# Patient Record
Sex: Male | Born: 1960 | Race: White | Hispanic: No | Marital: Married | Smoking: Former smoker
Health system: Southern US, Community
[De-identification: ages and names within clinical notes are randomized; demographics above are authoritative.]

## PROBLEM LIST (undated history)

## (undated) DIAGNOSIS — E119 Type 2 diabetes mellitus without complications: Secondary | ICD-10-CM

## (undated) DIAGNOSIS — I214 Non-ST elevation (NSTEMI) myocardial infarction: Secondary | ICD-10-CM

## (undated) DIAGNOSIS — J9601 Acute respiratory failure with hypoxia: Secondary | ICD-10-CM

## (undated) DIAGNOSIS — E785 Hyperlipidemia, unspecified: Secondary | ICD-10-CM

## (undated) DIAGNOSIS — J441 Chronic obstructive pulmonary disease with (acute) exacerbation: Secondary | ICD-10-CM

## (undated) DIAGNOSIS — I10 Essential (primary) hypertension: Secondary | ICD-10-CM

## (undated) DIAGNOSIS — Z720 Tobacco use: Secondary | ICD-10-CM

---

## 2017-06-04 DIAGNOSIS — Z6835 Body mass index (BMI) 35.0-35.9, adult: Secondary | ICD-10-CM | POA: Insufficient documentation

## 2017-06-04 DIAGNOSIS — K429 Umbilical hernia without obstruction or gangrene: Secondary | ICD-10-CM | POA: Insufficient documentation

## 2017-06-04 HISTORY — DX: Umbilical hernia without obstruction or gangrene: K42.9

## 2017-06-04 HISTORY — DX: Body mass index (BMI) 35.0-35.9, adult: Z68.35

## 2017-10-25 ENCOUNTER — Inpatient Hospital Stay (HOSPITAL_COMMUNITY)
Admission: EM | Disposition: A | Discharge status: Home / Self Care | Payer: Self-pay | Source: Home / Self Care | Attending: Cardiothoracic Surgery

## 2017-10-25 ENCOUNTER — Encounter (HOSPITAL_COMMUNITY): Payer: Self-pay | Admitting: Emergency Medicine

## 2017-10-25 ENCOUNTER — Emergency Department (HOSPITAL_COMMUNITY): Payer: BLUE CROSS/BLUE SHIELD | Admitting: Certified Registered Nurse Anesthetist

## 2017-10-25 ENCOUNTER — Emergency Department (HOSPITAL_COMMUNITY): Payer: BLUE CROSS/BLUE SHIELD

## 2017-10-25 ENCOUNTER — Other Ambulatory Visit: Payer: Self-pay

## 2017-10-25 ENCOUNTER — Inpatient Hospital Stay (HOSPITAL_COMMUNITY)
Admission: EM | Admit: 2017-10-25 | Discharge: 2017-11-03 | DRG: 233 | Disposition: A | Discharge status: Home / Self Care | Payer: BLUE CROSS/BLUE SHIELD | Attending: Cardiothoracic Surgery | Admitting: Cardiothoracic Surgery

## 2017-10-25 DIAGNOSIS — I4891 Unspecified atrial fibrillation: Secondary | ICD-10-CM | POA: Diagnosis present

## 2017-10-25 DIAGNOSIS — Z6836 Body mass index (BMI) 36.0-36.9, adult: Secondary | ICD-10-CM

## 2017-10-25 DIAGNOSIS — E1165 Type 2 diabetes mellitus with hyperglycemia: Secondary | ICD-10-CM | POA: Diagnosis present

## 2017-10-25 DIAGNOSIS — J90 Pleural effusion, not elsewhere classified: Secondary | ICD-10-CM

## 2017-10-25 DIAGNOSIS — F17210 Nicotine dependence, cigarettes, uncomplicated: Secondary | ICD-10-CM | POA: Diagnosis present

## 2017-10-25 DIAGNOSIS — E785 Hyperlipidemia, unspecified: Secondary | ICD-10-CM

## 2017-10-25 DIAGNOSIS — J9811 Atelectasis: Secondary | ICD-10-CM | POA: Diagnosis not present

## 2017-10-25 DIAGNOSIS — E871 Hypo-osmolality and hyponatremia: Secondary | ICD-10-CM | POA: Diagnosis not present

## 2017-10-25 DIAGNOSIS — Z09 Encounter for follow-up examination after completed treatment for conditions other than malignant neoplasm: Secondary | ICD-10-CM

## 2017-10-25 DIAGNOSIS — R609 Edema, unspecified: Secondary | ICD-10-CM

## 2017-10-25 DIAGNOSIS — I2119 ST elevation (STEMI) myocardial infarction involving other coronary artery of inferior wall: Secondary | ICD-10-CM | POA: Diagnosis present

## 2017-10-25 DIAGNOSIS — E669 Obesity, unspecified: Secondary | ICD-10-CM | POA: Diagnosis present

## 2017-10-25 DIAGNOSIS — M7989 Other specified soft tissue disorders: Secondary | ICD-10-CM | POA: Diagnosis not present

## 2017-10-25 DIAGNOSIS — D62 Acute posthemorrhagic anemia: Secondary | ICD-10-CM | POA: Diagnosis not present

## 2017-10-25 DIAGNOSIS — Z23 Encounter for immunization: Secondary | ICD-10-CM | POA: Diagnosis not present

## 2017-10-25 DIAGNOSIS — T502X5A Adverse effect of carbonic-anhydrase inhibitors, benzothiadiazides and other diuretics, initial encounter: Secondary | ICD-10-CM | POA: Diagnosis not present

## 2017-10-25 DIAGNOSIS — I5031 Acute diastolic (congestive) heart failure: Secondary | ICD-10-CM | POA: Diagnosis not present

## 2017-10-25 DIAGNOSIS — I4901 Ventricular fibrillation: Secondary | ICD-10-CM

## 2017-10-25 DIAGNOSIS — J441 Chronic obstructive pulmonary disease with (acute) exacerbation: Secondary | ICD-10-CM | POA: Diagnosis not present

## 2017-10-25 DIAGNOSIS — Z951 Presence of aortocoronary bypass graft: Secondary | ICD-10-CM

## 2017-10-25 DIAGNOSIS — I2111 ST elevation (STEMI) myocardial infarction involving right coronary artery: Secondary | ICD-10-CM | POA: Diagnosis present

## 2017-10-25 DIAGNOSIS — J9601 Acute respiratory failure with hypoxia: Secondary | ICD-10-CM

## 2017-10-25 DIAGNOSIS — Z720 Tobacco use: Secondary | ICD-10-CM | POA: Diagnosis not present

## 2017-10-25 DIAGNOSIS — I2510 Atherosclerotic heart disease of native coronary artery without angina pectoris: Secondary | ICD-10-CM

## 2017-10-25 DIAGNOSIS — Z01810 Encounter for preprocedural cardiovascular examination: Secondary | ICD-10-CM | POA: Diagnosis not present

## 2017-10-25 DIAGNOSIS — Y92230 Patient room in hospital as the place of occurrence of the external cause: Secondary | ICD-10-CM | POA: Diagnosis not present

## 2017-10-25 DIAGNOSIS — J9690 Respiratory failure, unspecified, unspecified whether with hypoxia or hypercapnia: Secondary | ICD-10-CM

## 2017-10-25 DIAGNOSIS — Z79890 Hormone replacement therapy (postmenopausal): Secondary | ICD-10-CM

## 2017-10-25 DIAGNOSIS — I469 Cardiac arrest, cause unspecified: Secondary | ICD-10-CM

## 2017-10-25 DIAGNOSIS — E119 Type 2 diabetes mellitus without complications: Secondary | ICD-10-CM

## 2017-10-25 DIAGNOSIS — I25110 Atherosclerotic heart disease of native coronary artery with unstable angina pectoris: Secondary | ICD-10-CM | POA: Diagnosis not present

## 2017-10-25 DIAGNOSIS — J939 Pneumothorax, unspecified: Secondary | ICD-10-CM

## 2017-10-25 DIAGNOSIS — J690 Pneumonitis due to inhalation of food and vomit: Secondary | ICD-10-CM | POA: Diagnosis not present

## 2017-10-25 DIAGNOSIS — I361 Nonrheumatic tricuspid (valve) insufficiency: Secondary | ICD-10-CM | POA: Diagnosis not present

## 2017-10-25 DIAGNOSIS — I110 Hypertensive heart disease with heart failure: Secondary | ICD-10-CM | POA: Diagnosis present

## 2017-10-25 DIAGNOSIS — D689 Coagulation defect, unspecified: Secondary | ICD-10-CM | POA: Diagnosis not present

## 2017-10-25 DIAGNOSIS — I10 Essential (primary) hypertension: Secondary | ICD-10-CM

## 2017-10-25 DIAGNOSIS — E876 Hypokalemia: Secondary | ICD-10-CM

## 2017-10-25 DIAGNOSIS — I214 Non-ST elevation (NSTEMI) myocardial infarction: Secondary | ICD-10-CM | POA: Diagnosis not present

## 2017-10-25 DIAGNOSIS — I472 Ventricular tachycardia: Secondary | ICD-10-CM | POA: Diagnosis not present

## 2017-10-25 DIAGNOSIS — I462 Cardiac arrest due to underlying cardiac condition: Secondary | ICD-10-CM | POA: Diagnosis present

## 2017-10-25 HISTORY — DX: Atherosclerotic heart disease of native coronary artery without angina pectoris: I25.10

## 2017-10-25 HISTORY — PX: CORONARY ARTERY BYPASS GRAFT: SHX141

## 2017-10-25 HISTORY — DX: Presence of aortocoronary bypass graft: Z95.1

## 2017-10-25 HISTORY — DX: Essential (primary) hypertension: I10

## 2017-10-25 HISTORY — DX: Hyperlipidemia, unspecified: E78.5

## 2017-10-25 HISTORY — DX: Cardiac arrest, cause unspecified: I46.9

## 2017-10-25 HISTORY — DX: Ventricular fibrillation: I49.01

## 2017-10-25 HISTORY — PX: LEFT HEART CATH AND CORONARY ANGIOGRAPHY: CATH118249

## 2017-10-25 HISTORY — PX: TEE WITHOUT CARDIOVERSION: SHX5443

## 2017-10-25 HISTORY — DX: Tobacco use: Z72.0

## 2017-10-25 HISTORY — DX: Type 2 diabetes mellitus without complications: E11.9

## 2017-10-25 HISTORY — PX: IABP INSERTION: CATH118242

## 2017-10-25 LAB — CBC
HCT: 43.3 % (ref 39.0–52.0)
HCT: 37.7 % — ABNORMAL LOW (ref 39.0–52.0)
HCT: 30.7 % — ABNORMAL LOW (ref 39.0–52.0)
Hemoglobin: 14.3 g/dL (ref 13.0–17.0)
Hemoglobin: 12.5 g/dL — ABNORMAL LOW (ref 13.0–17.0)
Hemoglobin: 10.1 g/dL — ABNORMAL LOW (ref 13.0–17.0)
MCH: 30.6 pg (ref 26.0–34.0)
MCH: 30.3 pg (ref 26.0–34.0)
MCH: 30.1 pg (ref 26.0–34.0)
MCHC: 33 g/dL (ref 30.0–36.0)
MCHC: 33.2 g/dL (ref 30.0–36.0)
MCHC: 32.9 g/dL (ref 30.0–36.0)
MCV: 92.5 fL (ref 78.0–100.0)
MCV: 91.3 fL (ref 78.0–100.0)
MCV: 91.6 fL (ref 78.0–100.0)
PLATELETS: 350 10*3/uL (ref 150–400)
PLATELETS: 297 10*3/uL (ref 150–400)
PLATELETS: 206 10*3/uL (ref 150–400)
RBC: 4.68 MIL/uL (ref 4.22–5.81)
RBC: 4.13 MIL/uL — AB (ref 4.22–5.81)
RBC: 3.35 MIL/uL — AB (ref 4.22–5.81)
RDW: 13.1 % (ref 11.5–15.5)
RDW: 13.3 % (ref 11.5–15.5)
RDW: 13.2 % (ref 11.5–15.5)
WBC: 13.1 10*3/uL — AB (ref 4.0–10.5)
WBC: 13.2 10*3/uL — AB (ref 4.0–10.5)
WBC: 15.2 10*3/uL — ABNORMAL HIGH (ref 4.0–10.5)

## 2017-10-25 LAB — COMPREHENSIVE METABOLIC PANEL
ALK PHOS: 73 U/L (ref 38–126)
ALT: 259 U/L — AB (ref 17–63)
AST: 170 U/L — ABNORMAL HIGH (ref 15–41)
Albumin: 3.4 g/dL — ABNORMAL LOW (ref 3.5–5.0)
Anion gap: 8 (ref 5–15)
BILIRUBIN TOTAL: 0.9 mg/dL (ref 0.3–1.2)
BUN: 13 mg/dL (ref 6–20)
CALCIUM: 8.6 mg/dL — AB (ref 8.9–10.3)
CO2: 22 mmol/L (ref 22–32)
CREATININE: 0.95 mg/dL (ref 0.61–1.24)
Chloride: 108 mmol/L (ref 101–111)
Glucose, Bld: 163 mg/dL — ABNORMAL HIGH (ref 65–99)
Potassium: 3.7 mmol/L (ref 3.5–5.1)
Sodium: 138 mmol/L (ref 135–145)
TOTAL PROTEIN: 5.7 g/dL — AB (ref 6.5–8.1)

## 2017-10-25 LAB — POCT I-STAT, CHEM 8
BUN: 13 mg/dL (ref 6–20)
BUN: 11 mg/dL (ref 6–20)
BUN: 12 mg/dL (ref 6–20)
BUN: 13 mg/dL (ref 6–20)
BUN: 14 mg/dL (ref 6–20)
CALCIUM ION: 1.23 mmol/L (ref 1.15–1.40)
CALCIUM ION: 0.94 mmol/L — AB (ref 1.15–1.40)
CALCIUM ION: 1.04 mmol/L — AB (ref 1.15–1.40)
CALCIUM ION: 1.07 mmol/L — AB (ref 1.15–1.40)
CHLORIDE: 108 mmol/L (ref 101–111)
CHLORIDE: 107 mmol/L (ref 101–111)
CHLORIDE: 105 mmol/L (ref 101–111)
CREATININE: 0.7 mg/dL (ref 0.61–1.24)
Calcium, Ion: 1.2 mmol/L (ref 1.15–1.40)
Chloride: 106 mmol/L (ref 101–111)
Chloride: 107 mmol/L (ref 101–111)
Creatinine, Ser: 0.5 mg/dL — ABNORMAL LOW (ref 0.61–1.24)
Creatinine, Ser: 0.7 mg/dL (ref 0.61–1.24)
Creatinine, Ser: 0.7 mg/dL (ref 0.61–1.24)
Creatinine, Ser: 0.7 mg/dL (ref 0.61–1.24)
GLUCOSE: 119 mg/dL — AB (ref 65–99)
GLUCOSE: 112 mg/dL — AB (ref 65–99)
GLUCOSE: 139 mg/dL — AB (ref 65–99)
Glucose, Bld: 91 mg/dL (ref 65–99)
Glucose, Bld: 150 mg/dL — ABNORMAL HIGH (ref 65–99)
HCT: 35 % — ABNORMAL LOW (ref 39.0–52.0)
HCT: 26 % — ABNORMAL LOW (ref 39.0–52.0)
HCT: 32 % — ABNORMAL LOW (ref 39.0–52.0)
HCT: 26 % — ABNORMAL LOW (ref 39.0–52.0)
HCT: 24 % — ABNORMAL LOW (ref 39.0–52.0)
HEMOGLOBIN: 10.9 g/dL — AB (ref 13.0–17.0)
HEMOGLOBIN: 8.2 g/dL — AB (ref 13.0–17.0)
Hemoglobin: 11.9 g/dL — ABNORMAL LOW (ref 13.0–17.0)
Hemoglobin: 8.8 g/dL — ABNORMAL LOW (ref 13.0–17.0)
Hemoglobin: 8.8 g/dL — ABNORMAL LOW (ref 13.0–17.0)
POTASSIUM: 5.7 mmol/L — AB (ref 3.5–5.1)
Potassium: 4.1 mmol/L (ref 3.5–5.1)
Potassium: 4.7 mmol/L (ref 3.5–5.1)
Potassium: 4.9 mmol/L (ref 3.5–5.1)
Potassium: 4.9 mmol/L (ref 3.5–5.1)
SODIUM: 140 mmol/L (ref 135–145)
SODIUM: 141 mmol/L (ref 135–145)
Sodium: 143 mmol/L (ref 135–145)
Sodium: 142 mmol/L (ref 135–145)
Sodium: 139 mmol/L (ref 135–145)
TCO2: 26 mmol/L (ref 22–32)
TCO2: 20 mmol/L — ABNORMAL LOW (ref 22–32)
TCO2: 23 mmol/L (ref 22–32)
TCO2: 27 mmol/L (ref 22–32)
TCO2: 23 mmol/L (ref 22–32)

## 2017-10-25 LAB — POCT I-STAT 3, ART BLOOD GAS (G3+)
ACID-BASE DEFICIT: 3 mmol/L — AB (ref 0.0–2.0)
Acid-Base Excess: 4 mmol/L — ABNORMAL HIGH (ref 0.0–2.0)
Acid-base deficit: 3 mmol/L — ABNORMAL HIGH (ref 0.0–2.0)
Acid-base deficit: 5 mmol/L — ABNORMAL HIGH (ref 0.0–2.0)
Acid-base deficit: 3 mmol/L — ABNORMAL HIGH (ref 0.0–2.0)
BICARBONATE: 23.1 mmol/L (ref 20.0–28.0)
Bicarbonate: 25.1 mmol/L (ref 20.0–28.0)
Bicarbonate: 21.1 mmol/L (ref 20.0–28.0)
Bicarbonate: 27.7 mmol/L (ref 20.0–28.0)
Bicarbonate: 22.8 mmol/L (ref 20.0–28.0)
Bicarbonate: 26.4 mmol/L (ref 20.0–28.0)
O2 SAT: 100 %
O2 SAT: 100 %
O2 SAT: 100 %
O2 SAT: 88 %
O2 Saturation: 89 %
O2 Saturation: 96 %
PCO2 ART: 43.3 mmHg (ref 32.0–48.0)
PH ART: 7.457 — AB (ref 7.350–7.450)
Patient temperature: 36.3
TCO2: 27 mmol/L (ref 22–32)
TCO2: 22 mmol/L (ref 22–32)
TCO2: 29 mmol/L (ref 22–32)
TCO2: 24 mmol/L (ref 22–32)
TCO2: 24 mmol/L (ref 22–32)
TCO2: 28 mmol/L (ref 22–32)
pCO2 arterial: 57.6 mmHg — ABNORMAL HIGH (ref 32.0–48.0)
pCO2 arterial: 39.3 mmHg (ref 32.0–48.0)
pCO2 arterial: 44.2 mmHg (ref 32.0–48.0)
pCO2 arterial: 44.5 mmHg (ref 32.0–48.0)
pCO2 arterial: 51 mmHg — ABNORMAL HIGH (ref 32.0–48.0)
pH, Arterial: 7.247 — ABNORMAL LOW (ref 7.350–7.450)
pH, Arterial: 7.297 — ABNORMAL LOW (ref 7.350–7.450)
pH, Arterial: 7.318 — ABNORMAL LOW (ref 7.350–7.450)
pH, Arterial: 7.326 — ABNORMAL LOW (ref 7.350–7.450)
pH, Arterial: 7.319 — ABNORMAL LOW (ref 7.350–7.450)
pO2, Arterial: 407 mmHg — ABNORMAL HIGH (ref 83.0–108.0)
pO2, Arterial: 265 mmHg — ABNORMAL HIGH (ref 83.0–108.0)
pO2, Arterial: 324 mmHg — ABNORMAL HIGH (ref 83.0–108.0)
pO2, Arterial: 59 mmHg — ABNORMAL LOW (ref 83.0–108.0)
pO2, Arterial: 61 mmHg — ABNORMAL LOW (ref 83.0–108.0)
pO2, Arterial: 91 mmHg (ref 83.0–108.0)

## 2017-10-25 LAB — LIPID PANEL
Cholesterol: 125 mg/dL (ref 0–200)
HDL: 42 mg/dL (ref >40)
LDL CALC: 75 mg/dL (ref 0–99)
TRIGLYCERIDES: 40 mg/dL (ref <150)
Total CHOL/HDL Ratio: 3 RATIO
VLDL: 8 mg/dL (ref 0–40)

## 2017-10-25 LAB — HEMOGLOBIN AND HEMATOCRIT, BLOOD
HCT: 28.6 % — ABNORMAL LOW (ref 39.0–52.0)
Hemoglobin: 9.4 g/dL — ABNORMAL LOW (ref 13.0–17.0)

## 2017-10-25 LAB — I-STAT CHEM 8, ED
BUN: 14 mg/dL (ref 6–20)
CALCIUM ION: 1.17 mmol/L (ref 1.15–1.40)
CHLORIDE: 105 mmol/L (ref 101–111)
CREATININE: 1.1 mg/dL (ref 0.61–1.24)
GLUCOSE: 213 mg/dL — AB (ref 65–99)
HCT: 43 % (ref 39.0–52.0)
Hemoglobin: 14.6 g/dL (ref 13.0–17.0)
POTASSIUM: 3.6 mmol/L (ref 3.5–5.1)
Sodium: 141 mmol/L (ref 135–145)
TCO2: 21 mmol/L — ABNORMAL LOW (ref 22–32)

## 2017-10-25 LAB — BASIC METABOLIC PANEL
ANION GAP: 16 — AB (ref 5–15)
BUN: 13 mg/dL (ref 6–20)
CALCIUM: 9.5 mg/dL (ref 8.9–10.3)
CO2: 19 mmol/L — AB (ref 22–32)
Chloride: 104 mmol/L (ref 101–111)
Creatinine, Ser: 1.2 mg/dL (ref 0.61–1.24)
GFR calc Af Amer: >60 mL/min (ref >60)
GLUCOSE: 219 mg/dL — AB (ref 65–99)
POTASSIUM: 3.7 mmol/L (ref 3.5–5.1)
Sodium: 139 mmol/L (ref 135–145)

## 2017-10-25 LAB — HEMOGLOBIN A1C
HEMOGLOBIN A1C: 6.2 % — AB (ref 4.8–5.6)
Mean Plasma Glucose: 131.24 mg/dL

## 2017-10-25 LAB — ECHOCARDIOGRAM COMPLETE: Weight: 3894.21 oz

## 2017-10-25 LAB — I-STAT TROPONIN, ED: Troponin i, poc: 0.01 ng/mL (ref 0.00–0.08)

## 2017-10-25 LAB — PREPARE RBC (CROSSMATCH)

## 2017-10-25 LAB — CBG MONITORING, ED: Glucose-Capillary: 200 mg/dL — ABNORMAL HIGH (ref 65–99)

## 2017-10-25 LAB — ABO/RH: ABO/RH(D): O POS

## 2017-10-25 LAB — PLATELET COUNT: Platelets: 233 10*3/uL (ref 150–400)

## 2017-10-25 LAB — APTT: aPTT: 27 seconds (ref 24–36)

## 2017-10-25 LAB — PROTIME-INR
INR: 1.27
INR: 1.4
PROTHROMBIN TIME: 15.8 s — AB (ref 11.4–15.2)
PROTHROMBIN TIME: 17 s — AB (ref 11.4–15.2)

## 2017-10-25 LAB — TROPONIN I: TROPONIN I: 0.1 ng/mL — AB (ref <0.03)

## 2017-10-25 SURGERY — CORONARY ARTERY BYPASS GRAFTING (CABG)
Anesthesia: General | Site: Chest

## 2017-10-25 SURGERY — LEFT HEART CATH AND CORONARY ANGIOGRAPHY
Anesthesia: LOCAL

## 2017-10-25 MED ORDER — VANCOMYCIN HCL IN DEXTROSE 1-5 GM/200ML-% IV SOLN
1000.0000 mg | Freq: Once | INTRAVENOUS | Status: AC
  Administered 2017-10-26: 1000 mg via INTRAVENOUS
  Filled 2017-10-25: qty 200

## 2017-10-25 MED ORDER — METOPROLOL TARTRATE 5 MG/5ML IV SOLN
2.5000 mg | INTRAVENOUS | Status: DC | PRN
  Administered 2017-10-27: 5 mg via INTRAVENOUS
  Filled 2017-10-25: qty 5

## 2017-10-25 MED ORDER — TRANEXAMIC ACID 1000 MG/10ML IV SOLN
1.5000 mg/kg/h | INTRAVENOUS | Status: AC
  Administered 2017-10-25: 1.5 mg/kg/h via INTRAVENOUS
  Filled 2017-10-25: qty 25

## 2017-10-25 MED ORDER — LIDOCAINE HCL 1 % IJ SOLN
INTRAMUSCULAR | Status: AC
  Filled 2017-10-25: qty 20

## 2017-10-25 MED ORDER — TRAMADOL HCL 50 MG PO TABS
50.0000 mg | ORAL_TABLET | ORAL | Status: DC | PRN
  Administered 2017-10-29 – 2017-10-30 (×2): 100 mg via ORAL
  Filled 2017-10-25 (×2): qty 2

## 2017-10-25 MED ORDER — ACETAMINOPHEN 650 MG RE SUPP: 650.0000 mg | Freq: Once | RECTAL | Status: AC

## 2017-10-25 MED ORDER — DEXMEDETOMIDINE HCL IN NACL 400 MCG/100ML IV SOLN
0.1000 ug/kg/h | INTRAVENOUS | Status: DC
  Filled 2017-10-25: qty 100

## 2017-10-25 MED ORDER — ASPIRIN EC 325 MG PO TBEC
325.0000 mg | DELAYED_RELEASE_TABLET | Freq: Every day | ORAL | Status: DC
  Administered 2017-10-27 – 2017-11-03 (×7): 325 mg via ORAL
  Filled 2017-10-25 (×7): qty 1

## 2017-10-25 MED ORDER — BISACODYL 10 MG RE SUPP: 10.0000 mg | Freq: Every day | RECTAL | Status: DC

## 2017-10-25 MED ORDER — MIDAZOLAM HCL 2 MG/2ML IJ SOLN
INTRAMUSCULAR | Status: AC
  Filled 2017-10-25: qty 2

## 2017-10-25 MED ORDER — HEMOSTATIC AGENTS (NO CHARGE) OPTIME
TOPICAL | Status: DC | PRN
  Administered 2017-10-25 (×4): 1 via TOPICAL

## 2017-10-25 MED ORDER — METOPROLOL TARTRATE 25 MG/10 ML ORAL SUSPENSION: 12.5000 mg | Freq: Two times a day (BID) | ORAL | Status: DC

## 2017-10-25 MED ORDER — MIDAZOLAM HCL 2 MG/2ML IJ SOLN
INTRAMUSCULAR | Status: DC | PRN
  Administered 2017-10-25 (×2): 1 mg via INTRAVENOUS

## 2017-10-25 MED ORDER — FENTANYL CITRATE (PF) 100 MCG/2ML IJ SOLN
INTRAMUSCULAR | Status: DC | PRN
  Administered 2017-10-25 (×2): 50 ug via INTRAVENOUS

## 2017-10-25 MED ORDER — HEPARIN SODIUM (PORCINE) 1000 UNIT/ML IJ SOLN
INTRAMUSCULAR | Status: DC | PRN
  Administered 2017-10-25: 37 mL via INTRAVENOUS
  Administered 2017-10-25: 2 mL via INTRAVENOUS

## 2017-10-25 MED ORDER — FENTANYL CITRATE (PF) 250 MCG/5ML IJ SOLN
INTRAMUSCULAR | Status: AC
  Filled 2017-10-25: qty 20

## 2017-10-25 MED ORDER — DOCUSATE SODIUM 100 MG PO CAPS
200.0000 mg | ORAL_CAPSULE | Freq: Every day | ORAL | Status: DC
  Administered 2017-10-27 – 2017-11-02 (×7): 200 mg via ORAL
  Filled 2017-10-25 (×8): qty 2

## 2017-10-25 MED ORDER — MORPHINE SULFATE (PF) 2 MG/ML IV SOLN
2.0000 mg | INTRAVENOUS | Status: DC | PRN
  Administered 2017-10-26: 2 mg via INTRAVENOUS
  Administered 2017-10-26 – 2017-10-27 (×4): 4 mg via INTRAVENOUS
  Administered 2017-10-27 – 2017-10-28 (×2): 2 mg via INTRAVENOUS
  Filled 2017-10-25: qty 2
  Filled 2017-10-25 (×2): qty 1
  Filled 2017-10-25 (×4): qty 2
  Filled 2017-10-25: qty 1

## 2017-10-25 MED ORDER — ACETAMINOPHEN 500 MG PO TABS
1000.0000 mg | ORAL_TABLET | Freq: Four times a day (QID) | ORAL | Status: AC
  Administered 2017-10-26 – 2017-10-30 (×13): 1000 mg via ORAL
  Filled 2017-10-25 (×15): qty 2

## 2017-10-25 MED ORDER — TRANEXAMIC ACID (OHS) BOLUS VIA INFUSION
15.0000 mg/kg | INTRAVENOUS | Status: AC
  Administered 2017-10-25: 1656 mg via INTRAVENOUS
  Filled 2017-10-25: qty 1656

## 2017-10-25 MED ORDER — ASPIRIN 81 MG PO CHEW
324.0000 mg | CHEWABLE_TABLET | Freq: Once | ORAL | Status: AC
  Administered 2017-10-25: 324 mg via ORAL
  Filled 2017-10-25: qty 4

## 2017-10-25 MED ORDER — FUROSEMIDE 10 MG/ML IJ SOLN
INTRAMUSCULAR | Status: AC
  Filled 2017-10-25: qty 4

## 2017-10-25 MED ORDER — BISACODYL 5 MG PO TBEC: 5.0000 mg | DELAYED_RELEASE_TABLET | Freq: Once | ORAL | Status: DC

## 2017-10-25 MED ORDER — SODIUM CHLORIDE 0.9% FLUSH: 3.0000 mL | INTRAVENOUS | Status: DC | PRN

## 2017-10-25 MED ORDER — METOCLOPRAMIDE HCL 5 MG/ML IJ SOLN
10.0000 mg | Freq: Four times a day (QID) | INTRAMUSCULAR | Status: AC
  Administered 2017-10-25 – 2017-10-30 (×19): 10 mg via INTRAVENOUS
  Filled 2017-10-25 (×18): qty 2

## 2017-10-25 MED ORDER — PLASMA-LYTE 148 IV SOLN
INTRAVENOUS | Status: DC
  Filled 2017-10-25: qty 2.5

## 2017-10-25 MED ORDER — NOREPINEPHRINE BITARTRATE 1 MG/ML IV SOLN
0.0000 ug/min | INTRAVENOUS | Status: DC
  Filled 2017-10-25: qty 4

## 2017-10-25 MED ORDER — SODIUM CHLORIDE 0.9 % IV SOLN
0.0000 ug/min | INTRAVENOUS | Status: DC
  Filled 2017-10-25: qty 2

## 2017-10-25 MED ORDER — ALBUMIN HUMAN 5 % IV SOLN
INTRAVENOUS | Status: DC | PRN
  Administered 2017-10-25 (×2): via INTRAVENOUS

## 2017-10-25 MED ORDER — CHLORHEXIDINE GLUCONATE 0.12 % MT SOLN: 15.0000 mL | Freq: Once | OROMUCOSAL | Status: DC

## 2017-10-25 MED ORDER — SODIUM BICARBONATE 8.4 % IV SOLN
INTRAVENOUS | Status: AC
  Filled 2017-10-25: qty 200

## 2017-10-25 MED ORDER — HEPARIN SODIUM (PORCINE) 1000 UNIT/ML IJ SOLN
INTRAMUSCULAR | Status: DC | PRN
  Administered 2017-10-25 (×2): 5000 [IU] via INTRAVENOUS

## 2017-10-25 MED ORDER — SODIUM CHLORIDE 0.9 % IV SOLN
INTRAVENOUS | Status: DC
  Administered 2017-10-27 (×2): via INTRAVENOUS

## 2017-10-25 MED ORDER — TEMAZEPAM 15 MG PO CAPS: 15.0000 mg | ORAL_CAPSULE | Freq: Once | ORAL | Status: DC | PRN

## 2017-10-25 MED ORDER — FENTANYL CITRATE (PF) 250 MCG/5ML IJ SOLN
INTRAMUSCULAR | Status: DC | PRN
  Administered 2017-10-25: 100 ug via INTRAVENOUS
  Administered 2017-10-25: 50 ug via INTRAVENOUS
  Administered 2017-10-25: 250 ug via INTRAVENOUS
  Administered 2017-10-25: 350 ug via INTRAVENOUS
  Administered 2017-10-25: 150 ug via INTRAVENOUS

## 2017-10-25 MED ORDER — PLASMA-LYTE 148 IV SOLN
INTRAVENOUS | Status: DC | PRN
  Administered 2017-10-25: 500 mL

## 2017-10-25 MED ORDER — METOPROLOL TARTRATE 12.5 MG HALF TABLET: 12.5000 mg | ORAL_TABLET | Freq: Once | ORAL | Status: DC

## 2017-10-25 MED ORDER — HEPARIN SODIUM (PORCINE) 1000 UNIT/ML IJ SOLN
INTRAMUSCULAR | Status: AC
  Filled 2017-10-25: qty 1

## 2017-10-25 MED ORDER — MIDAZOLAM HCL 2 MG/2ML IJ SOLN
2.0000 mg | INTRAMUSCULAR | Status: DC | PRN
  Administered 2017-10-26 (×4): 2 mg via INTRAVENOUS
  Filled 2017-10-25 (×6): qty 2

## 2017-10-25 MED ORDER — LACTATED RINGERS IV SOLN: INTRAVENOUS | Status: DC

## 2017-10-25 MED ORDER — MILRINONE LACTATE IN DEXTROSE 20-5 MG/100ML-% IV SOLN
0.1250 ug/kg/min | INTRAVENOUS | Status: DC
  Filled 2017-10-25: qty 100

## 2017-10-25 MED ORDER — SODIUM CHLORIDE 0.9 % IV SOLN
750.0000 mg | INTRAVENOUS | Status: AC
  Administered 2017-10-25: 750 mg via INTRAVENOUS
  Filled 2017-10-25: qty 750

## 2017-10-25 MED ORDER — SODIUM CHLORIDE 0.9 % IV SOLN
INTRAVENOUS | Status: DC
  Filled 2017-10-25: qty 1

## 2017-10-25 MED ORDER — LACTATED RINGERS IV SOLN
INTRAVENOUS | Status: DC | PRN
  Administered 2017-10-25 (×2): via INTRAVENOUS

## 2017-10-25 MED ORDER — SODIUM CHLORIDE 0.9 % IV SOLN
1.5000 g | INTRAVENOUS | Status: AC
  Administered 2017-10-25: 1.5 g via INTRAVENOUS
  Filled 2017-10-25: qty 1.5

## 2017-10-25 MED ORDER — NOREPINEPHRINE BITARTRATE 1 MG/ML IV SOLN
INTRAVENOUS | Status: DC | PRN
  Administered 2017-10-25: 3 ug/min via INTRAVENOUS

## 2017-10-25 MED ORDER — 0.9 % SODIUM CHLORIDE (POUR BTL) OPTIME
TOPICAL | Status: DC | PRN
  Administered 2017-10-25: 6000 mL

## 2017-10-25 MED ORDER — ASPIRIN 81 MG PO CHEW
324.0000 mg | CHEWABLE_TABLET | Freq: Every day | ORAL | Status: DC
  Administered 2017-10-26 – 2017-10-31 (×2): 324 mg
  Filled 2017-10-25 (×2): qty 4

## 2017-10-25 MED ORDER — LEVALBUTEROL HCL 0.63 MG/3ML IN NEBU
0.6300 mg | INHALATION_SOLUTION | Freq: Four times a day (QID) | RESPIRATORY_TRACT | Status: DC | PRN
  Administered 2017-10-28: 0.63 mg via RESPIRATORY_TRACT
  Filled 2017-10-25: qty 3

## 2017-10-25 MED ORDER — MAGNESIUM SULFATE 50 % IJ SOLN
40.0000 meq | INTRAMUSCULAR | Status: DC
  Filled 2017-10-25: qty 9.85

## 2017-10-25 MED ORDER — NOREPINEPHRINE BITARTRATE 1 MG/ML IV SOLN
0.0000 ug/min | INTRAVENOUS | Status: DC
  Administered 2017-10-26: 7 ug/min via INTRAVENOUS
  Filled 2017-10-25: qty 4

## 2017-10-25 MED ORDER — BISACODYL 5 MG PO TBEC
10.0000 mg | DELAYED_RELEASE_TABLET | Freq: Every day | ORAL | Status: DC
  Administered 2017-10-26 – 2017-11-02 (×8): 10 mg via ORAL
  Filled 2017-10-25 (×9): qty 2

## 2017-10-25 MED ORDER — PANTOPRAZOLE SODIUM 40 MG PO TBEC
40.0000 mg | DELAYED_RELEASE_TABLET | Freq: Every day | ORAL | Status: DC
  Administered 2017-10-27 – 2017-11-03 (×8): 40 mg via ORAL
  Filled 2017-10-25 (×8): qty 1

## 2017-10-25 MED ORDER — CEFAZOLIN SODIUM-DEXTROSE 2-4 GM/100ML-% IV SOLN
2.0000 g | Freq: Three times a day (TID) | INTRAVENOUS | Status: AC
  Administered 2017-10-26 – 2017-10-27 (×6): 2 g via INTRAVENOUS
  Filled 2017-10-25 (×6): qty 100

## 2017-10-25 MED ORDER — OXYCODONE HCL 5 MG PO TABS
5.0000 mg | ORAL_TABLET | ORAL | Status: DC | PRN
  Administered 2017-10-26 (×2): 10 mg via ORAL
  Administered 2017-10-26: 5 mg via ORAL
  Administered 2017-10-27 – 2017-10-28 (×10): 10 mg via ORAL
  Administered 2017-10-29: 5 mg via ORAL
  Administered 2017-10-29: 10 mg via ORAL
  Administered 2017-10-30 – 2017-10-31 (×2): 5 mg via ORAL
  Administered 2017-11-01 – 2017-11-02 (×3): 10 mg via ORAL
  Filled 2017-10-25 (×14): qty 2
  Filled 2017-10-25: qty 1
  Filled 2017-10-25: qty 2
  Filled 2017-10-25: qty 1
  Filled 2017-10-25: qty 2
  Filled 2017-10-25 (×2): qty 1

## 2017-10-25 MED ORDER — SODIUM CHLORIDE 0.9 % IV SOLN
30.0000 ug/min | INTRAVENOUS | Status: DC
  Filled 2017-10-25: qty 2

## 2017-10-25 MED ORDER — VANCOMYCIN HCL 10 G IV SOLR
1250.0000 mg | INTRAVENOUS | Status: AC
  Administered 2017-10-25: 1250 mg via INTRAVENOUS
  Filled 2017-10-25: qty 1250

## 2017-10-25 MED ORDER — PROTAMINE SULFATE 10 MG/ML IV SOLN
INTRAVENOUS | Status: DC | PRN
  Administered 2017-10-25: 300 mg via INTRAVENOUS

## 2017-10-25 MED ORDER — PHENYLEPHRINE HCL 10 MG/ML IJ SOLN
INTRAVENOUS | Status: DC | PRN
  Administered 2017-10-25: 50 ug/min via INTRAVENOUS
  Administered 2017-10-25: 100 ug/min via INTRAVENOUS

## 2017-10-25 MED ORDER — METOPROLOL TARTRATE 12.5 MG HALF TABLET
12.5000 mg | ORAL_TABLET | Freq: Two times a day (BID) | ORAL | Status: DC
  Administered 2017-10-27 – 2017-11-03 (×15): 12.5 mg via ORAL
  Filled 2017-10-25 (×15): qty 1

## 2017-10-25 MED ORDER — ETOMIDATE 2 MG/ML IV SOLN
INTRAVENOUS | Status: DC | PRN
  Administered 2017-10-25: 20 mg via INTRAVENOUS

## 2017-10-25 MED ORDER — SODIUM CHLORIDE 0.9 % IV SOLN
INTRAVENOUS | Status: DC
  Filled 2017-10-25: qty 30

## 2017-10-25 MED ORDER — LACTATED RINGERS IV SOLN
INTRAVENOUS | Status: DC | PRN
  Administered 2017-10-25 (×2): via INTRAVENOUS

## 2017-10-25 MED ORDER — EPINEPHRINE PF 1 MG/ML IJ SOLN
0.0000 ug/min | INTRAMUSCULAR | Status: DC
  Filled 2017-10-25: qty 4

## 2017-10-25 MED ORDER — SODIUM CHLORIDE 0.9 % IV SOLN: 250.0000 mL | INTRAVENOUS | Status: DC

## 2017-10-25 MED ORDER — ACETAMINOPHEN 160 MG/5ML PO SOLN
650.0000 mg | Freq: Once | ORAL | Status: AC
  Administered 2017-10-25: 650 mg

## 2017-10-25 MED ORDER — ACETAMINOPHEN 160 MG/5ML PO SOLN
1000.0000 mg | Freq: Four times a day (QID) | ORAL | Status: AC
  Administered 2017-10-26 – 2017-10-31 (×2): 1000 mg
  Filled 2017-10-25: qty 40.6

## 2017-10-25 MED ORDER — LIDOCAINE HCL (PF) 1 % IJ SOLN
INTRAMUSCULAR | Status: DC | PRN
  Administered 2017-10-25: 15 mL
  Administered 2017-10-25: 2 mL

## 2017-10-25 MED ORDER — NITROGLYCERIN IN D5W 200-5 MCG/ML-% IV SOLN: 0.0000 ug/min | INTRAVENOUS | Status: DC

## 2017-10-25 MED ORDER — SODIUM CHLORIDE 0.9 % IV SOLN
20.0000 ug | INTRAVENOUS | Status: AC
  Administered 2017-10-25: 20 ug via INTRAVENOUS
  Filled 2017-10-25: qty 5

## 2017-10-25 MED ORDER — SUCCINYLCHOLINE CHLORIDE 200 MG/10ML IV SOSY
PREFILLED_SYRINGE | INTRAVENOUS | Status: DC | PRN
  Administered 2017-10-25: 140 mg via INTRAVENOUS

## 2017-10-25 MED ORDER — TRANEXAMIC ACID 1000 MG/10ML IV SOLN
1.5000 mg/kg/h | INTRAVENOUS | Status: DC
  Filled 2017-10-25: qty 25

## 2017-10-25 MED ORDER — TRANEXAMIC ACID (OHS) PUMP PRIME SOLUTION
2.0000 mg/kg | INTRAVENOUS | Status: DC
  Filled 2017-10-25: qty 2.21

## 2017-10-25 MED ORDER — VERAPAMIL HCL 2.5 MG/ML IV SOLN
INTRAVENOUS | Status: AC
  Filled 2017-10-25: qty 2

## 2017-10-25 MED ORDER — CHLORHEXIDINE GLUCONATE CLOTH 2 % EX PADS: 6.0000 | MEDICATED_PAD | Freq: Once | CUTANEOUS | Status: DC

## 2017-10-25 MED ORDER — MIDAZOLAM HCL 10 MG/2ML IJ SOLN
INTRAMUSCULAR | Status: AC
  Filled 2017-10-25: qty 2

## 2017-10-25 MED ORDER — MAGNESIUM SULFATE 4 GM/100ML IV SOLN
4.0000 g | Freq: Once | INTRAVENOUS | Status: AC
  Administered 2017-10-25: 4 g via INTRAVENOUS
  Filled 2017-10-25: qty 100

## 2017-10-25 MED ORDER — VERAPAMIL HCL 2.5 MG/ML IV SOLN
INTRAVENOUS | Status: DC | PRN
  Administered 2017-10-25: 10 mL via INTRA_ARTERIAL

## 2017-10-25 MED ORDER — THROMBIN 5000 UNITS EX SOLR
CUTANEOUS | Status: AC
  Filled 2017-10-25: qty 5000

## 2017-10-25 MED ORDER — ONDANSETRON HCL 4 MG/2ML IJ SOLN
4.0000 mg | Freq: Four times a day (QID) | INTRAMUSCULAR | Status: DC | PRN
  Administered 2017-10-26 – 2017-10-30 (×5): 4 mg via INTRAVENOUS
  Filled 2017-10-25 (×5): qty 2

## 2017-10-25 MED ORDER — MIDAZOLAM HCL 5 MG/5ML IJ SOLN
INTRAMUSCULAR | Status: DC | PRN
  Administered 2017-10-25 (×2): 3 mg via INTRAVENOUS
  Administered 2017-10-25: 4 mg via INTRAVENOUS

## 2017-10-25 MED ORDER — FUROSEMIDE 10 MG/ML IJ SOLN
INTRAMUSCULAR | Status: DC | PRN
  Administered 2017-10-25: 10 mg via INTRAMUSCULAR

## 2017-10-25 MED ORDER — NITROGLYCERIN IN D5W 200-5 MCG/ML-% IV SOLN
2.0000 ug/min | INTRAVENOUS | Status: AC
  Administered 2017-10-25: 16.6 ug/min via INTRAVENOUS
  Filled 2017-10-25: qty 250

## 2017-10-25 MED ORDER — SODIUM CHLORIDE 0.9 % IV SOLN
INTRAVENOUS | Status: DC | PRN
  Administered 2017-10-25: 21:00:00 via INTRAVENOUS

## 2017-10-25 MED ORDER — HEPARIN (PORCINE) IN NACL 2-0.9 UNIT/ML-% IJ SOLN
INTRAMUSCULAR | Status: AC
  Filled 2017-10-25: qty 500

## 2017-10-25 MED ORDER — SODIUM CHLORIDE 0.9% FLUSH
3.0000 mL | Freq: Two times a day (BID) | INTRAVENOUS | Status: DC
  Administered 2017-10-26 – 2017-10-27 (×3): 3 mL via INTRAVENOUS
  Administered 2017-10-27: 6 mL via INTRAVENOUS
  Administered 2017-10-28 – 2017-10-29 (×2): 3 mL via INTRAVENOUS

## 2017-10-25 MED ORDER — SODIUM CHLORIDE 0.9 % IV SOLN
INTRAVENOUS | Status: DC | PRN
  Administered 2017-10-25: 0.2 ug/kg/h via INTRAVENOUS

## 2017-10-25 MED ORDER — ROCURONIUM BROMIDE 100 MG/10ML IV SOLN
INTRAVENOUS | Status: DC | PRN
  Administered 2017-10-25 (×3): 100 mg via INTRAVENOUS
  Administered 2017-10-25: 50 mg via INTRAVENOUS

## 2017-10-25 MED ORDER — IOPAMIDOL (ISOVUE-370) INJECTION 76%
INTRAVENOUS | Status: DC | PRN
  Administered 2017-10-25: 85 mL

## 2017-10-25 MED ORDER — ALBUMIN HUMAN 5 % IV SOLN
250.0000 mL | INTRAVENOUS | Status: AC | PRN
Start: 2017-10-25 — End: 2017-10-26
  Administered 2017-10-26 (×2): 250 mL via INTRAVENOUS
  Filled 2017-10-25: qty 250

## 2017-10-25 MED ORDER — PROTAMINE SULFATE 10 MG/ML IV SOLN
INTRAVENOUS | Status: AC
  Filled 2017-10-25: qty 20

## 2017-10-25 MED ORDER — FENTANYL CITRATE (PF) 100 MCG/2ML IJ SOLN
INTRAMUSCULAR | Status: AC
  Filled 2017-10-25: qty 2

## 2017-10-25 MED ORDER — IOPAMIDOL (ISOVUE-370) INJECTION 76%
INTRAVENOUS | Status: AC
  Filled 2017-10-25: qty 125

## 2017-10-25 MED ORDER — MORPHINE SULFATE (PF) 2 MG/ML IV SOLN
1.0000 mg | INTRAVENOUS | Status: AC | PRN
  Administered 2017-10-26: 2 mg via INTRAVENOUS
  Administered 2017-10-26: 4 mg via INTRAVENOUS
  Administered 2017-10-26 (×2): 2 mg via INTRAVENOUS
  Filled 2017-10-25 (×2): qty 2

## 2017-10-25 MED ORDER — NITROPRUSSIDE SODIUM 25 MG/ML IV SOLN
0.0000 ug/kg/min | INTRAVENOUS | Status: DC
  Filled 2017-10-25: qty 2

## 2017-10-25 MED ORDER — SODIUM CHLORIDE 0.9 % IJ SOLN
OROMUCOSAL | Status: DC | PRN
  Administered 2017-10-25 (×3): 4 mL via TOPICAL

## 2017-10-25 MED ORDER — DEXMEDETOMIDINE HCL IN NACL 200 MCG/50ML IV SOLN
0.0000 ug/kg/h | INTRAVENOUS | Status: DC
  Administered 2017-10-25 – 2017-10-26 (×3): 0.7 ug/kg/h via INTRAVENOUS
  Filled 2017-10-25 (×5): qty 50

## 2017-10-25 MED ORDER — INSULIN REGULAR BOLUS VIA INFUSION
0.0000 [IU] | Freq: Three times a day (TID) | INTRAVENOUS | Status: DC
  Filled 2017-10-25: qty 10

## 2017-10-25 MED ORDER — THROMBIN 5000 UNITS EX SOLR
CUTANEOUS | Status: DC | PRN
  Administered 2017-10-25: 5000 [IU] via TOPICAL

## 2017-10-25 MED ORDER — MILRINONE LACTATE IN DEXTROSE 20-5 MG/100ML-% IV SOLN
INTRAVENOUS | Status: DC | PRN
  Administered 2017-10-25: 0.25 ug/kg/min via INTRAVENOUS

## 2017-10-25 MED ORDER — POTASSIUM CHLORIDE 2 MEQ/ML IV SOLN
80.0000 meq | INTRAVENOUS | Status: DC
  Filled 2017-10-25: qty 40

## 2017-10-25 MED ORDER — LACTATED RINGERS IV SOLN: 500.0000 mL | Freq: Once | INTRAVENOUS | Status: DC | PRN

## 2017-10-25 MED ORDER — SODIUM CHLORIDE 0.9 % IV SOLN
INTRAVENOUS | Status: DC
  Administered 2017-10-25: 500 mL via INTRAVENOUS

## 2017-10-25 MED ORDER — DOPAMINE-DEXTROSE 3.2-5 MG/ML-% IV SOLN
0.0000 ug/kg/min | INTRAVENOUS | Status: DC
  Filled 2017-10-25: qty 250

## 2017-10-25 MED ORDER — CALCIUM CHLORIDE 10 % IV SOLN
INTRAVENOUS | Status: DC | PRN
  Administered 2017-10-25 (×2): 500 mg via INTRAVENOUS

## 2017-10-25 MED ORDER — MILRINONE LACTATE IN DEXTROSE 20-5 MG/100ML-% IV SOLN
0.2500 ug/kg/min | INTRAVENOUS | Status: DC
  Administered 2017-10-26 – 2017-10-29 (×4): 0.25 ug/kg/min via INTRAVENOUS
  Administered 2017-10-29: 0.125 ug/kg/min via INTRAVENOUS
  Administered 2017-10-30: 0.25 ug/kg/min via INTRAVENOUS
  Filled 2017-10-25 (×8): qty 100

## 2017-10-25 MED ORDER — SODIUM CHLORIDE 0.9 % IV SOLN
INTRAVENOUS | Status: DC | PRN
  Administered 2017-10-25: 1 [IU]/h via INTRAVENOUS

## 2017-10-25 MED ORDER — SODIUM CHLORIDE 0.9 % IV BOLUS
1000.0000 mL | Freq: Once | INTRAVENOUS | Status: AC
  Administered 2017-10-25: 1000 mL via INTRAVENOUS

## 2017-10-25 MED ORDER — FAMOTIDINE IN NACL 20-0.9 MG/50ML-% IV SOLN
20.0000 mg | Freq: Two times a day (BID) | INTRAVENOUS | Status: AC
  Administered 2017-10-25 – 2017-10-26 (×2): 20 mg via INTRAVENOUS
  Filled 2017-10-25: qty 50

## 2017-10-25 MED ORDER — SODIUM CHLORIDE 0.45 % IV SOLN
INTRAVENOUS | Status: DC | PRN
  Administered 2017-10-27: 20 mL/h via INTRAVENOUS

## 2017-10-25 MED ORDER — POTASSIUM CHLORIDE 10 MEQ/50ML IV SOLN: 10.0000 meq | INTRAVENOUS | Status: DC

## 2017-10-25 MED ORDER — CHLORHEXIDINE GLUCONATE 0.12 % MT SOLN
15.0000 mL | OROMUCOSAL | Status: AC
  Administered 2017-10-25: 15 mL via OROMUCOSAL

## 2017-10-25 MED ORDER — HEPARIN (PORCINE) IN NACL 2-0.9 UNIT/ML-% IJ SOLN
INTRAMUSCULAR | Status: AC | PRN
  Administered 2017-10-25 (×3): 500 mL

## 2017-10-25 SURGICAL SUPPLY — 18 items
BALLN IABP SENSA PLUS 8F 50CC (BALLOONS) ×2
BALLOON IABP SENS PLUS 8F 50CC (BALLOONS) ×1 IMPLANT
CATH INFINITI 5 FR JL3.5 (CATHETERS) ×2 IMPLANT
CATH INFINITI JR4 5F (CATHETERS) ×2 IMPLANT
COVER PRB 48X5XTLSCP FOLD TPE (BAG) ×2 IMPLANT
COVER PROBE 5X48 (BAG) ×2
DEVICE SECURE STATLOCK IABP (MISCELLANEOUS) ×4 IMPLANT
GLIDESHEATH SLEND A-KIT 6F 22G (SHEATH) ×2 IMPLANT
GUIDEWIRE INQWIRE 1.5J.035X260 (WIRE) ×1 IMPLANT
HOVERMATT SINGLE USE (MISCELLANEOUS) ×2 IMPLANT
INQWIRE 1.5J .035X260CM (WIRE) ×2
KIT ENCORE 26 ADVANTAGE (KITS) ×2 IMPLANT
KIT HEART LEFT (KITS) ×2 IMPLANT
KIT MICROPUNCTURE NIT STIFF (SHEATH) ×2 IMPLANT
PACK CARDIAC CATHETERIZATION (CUSTOM PROCEDURE TRAY) ×2 IMPLANT
TRANSDUCER W/STOPCOCK (MISCELLANEOUS) ×2 IMPLANT
TUBING CIL FLEX 10 FLL-RA (TUBING) ×2 IMPLANT
WIRE EMERALD 3MM-J .035X150CM (WIRE) ×2 IMPLANT

## 2017-10-25 SURGICAL SUPPLY — 103 items
ADAPTER CARDIO PERF ANTE/RETRO (ADAPTER) ×4 IMPLANT
BAG DECANTER FOR FLEXI CONT (MISCELLANEOUS) ×4 IMPLANT
BANDAGE ACE 4X5 VEL STRL LF (GAUZE/BANDAGES/DRESSINGS) ×4 IMPLANT
BANDAGE ACE 6X5 VEL STRL LF (GAUZE/BANDAGES/DRESSINGS) ×4 IMPLANT
BASKET HEART  (ORDER IN 25'S) (MISCELLANEOUS) ×1
BASKET HEART (ORDER IN 25'S) (MISCELLANEOUS) ×1
BASKET HEART (ORDER IN 25S) (MISCELLANEOUS) ×2 IMPLANT
BLADE 11 SAFETY STRL DISP (BLADE) ×4 IMPLANT
BLADE CLIPPER SURG (BLADE) IMPLANT
BLADE STERNUM SYSTEM 6 (BLADE) ×4 IMPLANT
BLADE SURG 12 STRL SS (BLADE) ×4 IMPLANT
BNDG GAUZE ELAST 4 BULKY (GAUZE/BANDAGES/DRESSINGS) ×4 IMPLANT
CANISTER SUCT 3000ML PPV (MISCELLANEOUS) ×4 IMPLANT
CANNULA GUNDRY RCSP 15FR (MISCELLANEOUS) ×4 IMPLANT
CATH CPB KIT VANTRIGT (MISCELLANEOUS) ×4 IMPLANT
CATH ROBINSON RED A/P 18FR (CATHETERS) ×16 IMPLANT
CATH THORACIC 28FR RT ANG (CATHETERS) ×4 IMPLANT
CATH THORACIC 36FR RT ANG (CATHETERS) ×4 IMPLANT
CLIP VESOCCLUDE SM WIDE 24/CT (CLIP) ×4 IMPLANT
CONN ST 1/4X3/8  BEN (MISCELLANEOUS) ×2
CONN ST 1/4X3/8 BEN (MISCELLANEOUS) ×2 IMPLANT
CRADLE DONUT ADULT HEAD (MISCELLANEOUS) ×4 IMPLANT
DERMABOND ADVANCED (GAUZE/BANDAGES/DRESSINGS) ×2
DERMABOND ADVANCED .7 DNX12 (GAUZE/BANDAGES/DRESSINGS) ×2 IMPLANT
DRAIN CHANNEL 32F RND 10.7 FF (WOUND CARE) ×4 IMPLANT
DRAPE CARDIOVASCULAR INCISE (DRAPES) ×2
DRAPE SLUSH/WARMER DISC (DRAPES) ×4 IMPLANT
DRAPE SRG 135X102X78XABS (DRAPES) ×2 IMPLANT
DRSG AQUACEL AG ADV 3.5X14 (GAUZE/BANDAGES/DRESSINGS) ×4 IMPLANT
ELECT BLADE 4.0 EZ CLEAN MEGAD (MISCELLANEOUS) ×4
ELECT BLADE 6.5 EXT (BLADE) ×4 IMPLANT
ELECT CAUTERY BLADE 6.4 (BLADE) ×4 IMPLANT
ELECT REM PT RETURN 9FT ADLT (ELECTROSURGICAL) ×8
ELECTRODE BLDE 4.0 EZ CLN MEGD (MISCELLANEOUS) ×2 IMPLANT
ELECTRODE REM PT RTRN 9FT ADLT (ELECTROSURGICAL) ×4 IMPLANT
FELT TEFLON 1X6 (MISCELLANEOUS) ×4 IMPLANT
GAUZE SPONGE 4X4 12PLY STRL (GAUZE/BANDAGES/DRESSINGS) ×8 IMPLANT
GAUZE SPONGE 4X4 12PLY STRL LF (GAUZE/BANDAGES/DRESSINGS) ×8 IMPLANT
GLOVE BIO SURGEON STRL SZ7.5 (GLOVE) ×12 IMPLANT
GLOVE BIOGEL M 6.5 STRL (GLOVE) ×20 IMPLANT
GLOVE BIOGEL PI IND STRL 6.5 (GLOVE) ×4 IMPLANT
GLOVE BIOGEL PI INDICATOR 6.5 (GLOVE) ×4
GLOVE SURG SS PI 6.0 STRL IVOR (GLOVE) ×8 IMPLANT
GOWN STRL REUS W/ TWL LRG LVL3 (GOWN DISPOSABLE) ×12 IMPLANT
GOWN STRL REUS W/TWL LRG LVL3 (GOWN DISPOSABLE) ×12
HEMOSTAT POWDER SURGIFOAM 1G (HEMOSTASIS) ×12 IMPLANT
HEMOSTAT SURGICEL 2X14 (HEMOSTASIS) ×4 IMPLANT
INSERT FOGARTY XLG (MISCELLANEOUS) IMPLANT
KIT BASIN OR (CUSTOM PROCEDURE TRAY) ×4 IMPLANT
KIT SUCTION CATH 14FR (SUCTIONS) ×4 IMPLANT
KIT TURNOVER KIT B (KITS) ×4 IMPLANT
KIT VASOVIEW HEMOPRO VH 3000 (KITS) ×4 IMPLANT
LEAD PACING MYOCARDI (MISCELLANEOUS) ×4 IMPLANT
MARKER GRAFT CORONARY BYPASS (MISCELLANEOUS) ×12 IMPLANT
NS IRRIG 1000ML POUR BTL (IV SOLUTION) ×20 IMPLANT
PACK E OPEN HEART (SUTURE) ×4 IMPLANT
PACK OPEN HEART (CUSTOM PROCEDURE TRAY) ×4 IMPLANT
PAD ARMBOARD 7.5X6 YLW CONV (MISCELLANEOUS) ×8 IMPLANT
PAD ELECT DEFIB RADIOL ZOLL (MISCELLANEOUS) ×4 IMPLANT
PENCIL BUTTON HOLSTER BLD 10FT (ELECTRODE) ×4 IMPLANT
PUNCH AORTIC ROTATE  4.5MM 8IN (MISCELLANEOUS) ×4 IMPLANT
PUNCH AORTIC ROTATE 4.0MM (MISCELLANEOUS) IMPLANT
PUNCH AORTIC ROTATE 4.5MM 8IN (MISCELLANEOUS) IMPLANT
PUNCH AORTIC ROTATE 5MM 8IN (MISCELLANEOUS) IMPLANT
SET CARDIOPLEGIA MPS 5001102 (MISCELLANEOUS) ×4 IMPLANT
SOLUTION ANTI FOG 6CC (MISCELLANEOUS) ×4 IMPLANT
SPOGE SURGIFLO 8M (HEMOSTASIS) ×4
SPONGE LAP 4X18 X RAY DECT (DISPOSABLE) ×4 IMPLANT
SPONGE SURGIFLO 8M (HEMOSTASIS) ×4 IMPLANT
SURGIFLO W/THROMBIN 8M KIT (HEMOSTASIS) IMPLANT
SUT BONE WAX W31G (SUTURE) ×4 IMPLANT
SUT MNCRL AB 4-0 PS2 18 (SUTURE) ×4 IMPLANT
SUT PROLENE 3 0 SH DA (SUTURE) IMPLANT
SUT PROLENE 3 0 SH1 36 (SUTURE) IMPLANT
SUT PROLENE 4 0 RB 1 (SUTURE) ×2
SUT PROLENE 4 0 SH DA (SUTURE) ×4 IMPLANT
SUT PROLENE 4-0 RB1 .5 CRCL 36 (SUTURE) ×2 IMPLANT
SUT PROLENE 5 0 C 1 36 (SUTURE) IMPLANT
SUT PROLENE 6 0 C 1 30 (SUTURE) ×12 IMPLANT
SUT PROLENE 6 0 CC (SUTURE) ×12 IMPLANT
SUT PROLENE 8 0 BV175 6 (SUTURE) ×4 IMPLANT
SUT PROLENE BLUE 7 0 (SUTURE) ×4 IMPLANT
SUT PROLENE POLY MONO (SUTURE) ×8 IMPLANT
SUT SILK  1 MH (SUTURE)
SUT SILK 1 MH (SUTURE) IMPLANT
SUT SILK 2 0 SH CR/8 (SUTURE) ×4 IMPLANT
SUT SILK 3 0 SH CR/8 (SUTURE) IMPLANT
SUT STEEL 6MS V (SUTURE) ×8 IMPLANT
SUT STEEL SZ 6 DBL 3X14 BALL (SUTURE) ×4 IMPLANT
SUT VIC AB 1 CTX 36 (SUTURE) ×4
SUT VIC AB 1 CTX36XBRD ANBCTR (SUTURE) ×4 IMPLANT
SUT VIC AB 2-0 CT1 27 (SUTURE) ×2
SUT VIC AB 2-0 CT1 TAPERPNT 27 (SUTURE) ×2 IMPLANT
SUT VIC AB 2-0 CTX 27 (SUTURE) IMPLANT
SUT VIC AB 3-0 X1 27 (SUTURE) IMPLANT
SYSTEM SAHARA CHEST DRAIN ATS (WOUND CARE) ×4 IMPLANT
TAPE CLOTH SURG 4X10 WHT LF (GAUZE/BANDAGES/DRESSINGS) ×8 IMPLANT
TOWEL GREEN STERILE (TOWEL DISPOSABLE) ×4 IMPLANT
TOWEL GREEN STERILE FF (TOWEL DISPOSABLE) ×4 IMPLANT
TRAY FOLEY SILVER 16FR TEMP (SET/KITS/TRAYS/PACK) ×4 IMPLANT
TUBING INSUFFLATION (TUBING) ×4 IMPLANT
UNDERPAD 30X30 (UNDERPADS AND DIAPERS) ×4 IMPLANT
WATER STERILE IRR 1000ML POUR (IV SOLUTION) ×8 IMPLANT
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
## 2017-10-25 NOTE — Progress Notes (Signed)
Pre-op Cardiac Surgery  Carotid Findings:  Patient was on a balloon pump, therefore reliable velocities were unable to be obtained. There is no obvious evidence of significant stenosis by color Doppler and plaque evaluation.  Limb Dopplers: per Dr. Donata ClayVan Trigt not needed due to patient's need to go to OR emergently.  10/25/2017 4:16 PM Gertie FeyMichelle Beanca Kiester, BS, RVT, RDCS, RDMS charting for Eddie DemarkJill Eunice, BS, RVT, RDMS

## 2017-10-25 NOTE — Progress Notes (Signed)
  Echocardiogram 2D Echocardiogram has been performed.  Eddie Black 10/25/2017, 5:34 PM

## 2017-10-25 NOTE — Anesthesia Procedure Notes (Signed)
Central Venous Catheter Insertion Performed by: Cecile Hearingurk, Lanard Arguijo Edward, MD, anesthesiologist Start/End3/29/2019 4:40 PM, 10/25/2017 4:50 PM Patient location: OR. Preanesthetic checklist: patient identified, IV checked, site marked, risks and benefits discussed, surgical consent, monitors and equipment checked, pre-op evaluation, timeout performed and anesthesia consent Position: Trendelenburg Lidocaine 1% used for infiltration and patient sedated Hand hygiene performed , maximum sterile barriers used  and Seldinger technique used Catheter size: 8.5 Fr Total catheter length 10. Central line was placed.Sheath introducer Swan type:thermodilution PA Cath depth:50 Procedure performed using ultrasound guided technique. Ultrasound Notes:anatomy identified, needle tip was noted to be adjacent to the nerve/plexus identified, no ultrasound evidence of intravascular and/or intraneural injection and image(s) printed for medical record Attempts: 1 Following insertion, line sutured, dressing applied and Biopatch. Post procedure assessment: blood return through all ports, free fluid flow and no air  Patient tolerated the procedure well with no immediate complications.

## 2017-10-25 NOTE — Progress Notes (Signed)
  Echocardiogram Echocardiogram Transesophageal has been performed.  Eddie Black Suit 10/25/2017, 5:39 PM

## 2017-10-25 NOTE — ED Notes (Signed)
Cardiology paged to Dr. Particia NearingHaviland per her request

## 2017-10-25 NOTE — Anesthesia Procedure Notes (Signed)
Procedure Name: Intubation Performed by: Valda Favia, CRNA Pre-anesthesia Checklist: Patient identified, Emergency Drugs available, Suction available and Patient being monitored Patient Re-evaluated:Patient Re-evaluated prior to induction Oxygen Delivery Method: Circle System Utilized Preoxygenation: Pre-oxygenation with 100% oxygen Induction Type: IV induction Ventilation: Mask ventilation without difficulty Laryngoscope Size: Mac and 4 Grade View: Grade I Tube type: Subglottic suction tube Tube size: 7.5 mm Number of attempts: 1 Airway Equipment and Method: Stylet and Oral airway Placement Confirmation: ETT inserted through vocal cords under direct vision,  positive ETCO2 and breath sounds checked- equal and bilateral Secured at: 25 cm Tube secured with: Tape Dental Injury: Teeth and Oropharynx as per pre-operative assessment

## 2017-10-25 NOTE — Anesthesia Procedure Notes (Signed)
Arterial Line Insertion Performed by: Flowers, Rokoshi T, Scientist, clinical (histocompatibility and immunogenetics)CRNA, CRNA  Emergency situation Patient sedated Left, radial was placed Catheter size: 20 G Hand hygiene performed , maximum sterile barriers used  and Seldinger technique used Allen's test indicative of satisfactory collateral circulation Attempts: 2 Procedure performed without using ultrasound guided technique. Following insertion, Biopatch and dressing applied. Post procedure assessment: normal  Patient tolerated the procedure well with no immediate complications.

## 2017-10-25 NOTE — Progress Notes (Signed)
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 Pt sedated and intubated. R hand noted with 6 r sheath sutured in - level 0; noted r hand slightly cool, cap refill noted 3-4 seconds, slight radial pulse palpated, noted 6 fr sheath kinked prior to removal. Unable to w/d blood from sheath due to kink. 6 fr sheath removed without difficulty. TR band placed with 10cc of air. RN at bedside- cap refill remains 3-4 seconds post sheath removal- comparable to l hand- cap refill and color, temp slightly cooler. TR band syringe placed at top of cart at distal end of bed. R hand elevated upon pillows at the height of the heart. SR up- no neurovascular compromise to r hand noted, RN made aware.
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
## 2017-10-25 NOTE — ED Notes (Signed)
Pt transported to cath lab.  

## 2017-10-25 NOTE — Transfer of Care (Signed)
Immediate Anesthesia Transfer of Care Note  Patient: Jill AlexandersRandall Finlayson  Procedure(s) Performed: CORONARY ARTERY BYPASS GRAFTING (CABG) times three, WITH ENDOSCOPIC HARVESTING OF RIGHT SAPHENOUS VEIN (N/A Chest) TRANSESOPHAGEAL ECHOCARDIOGRAM (TEE) (N/A )  Patient Location: ICU  Anesthesia Type:General  Level of Consciousness: sedated and Patient remains intubated per anesthesia plan  Airway & Oxygen Therapy: Patient remains intubated per anesthesia plan and Patient placed on Ventilator (see vital sign flow sheet for setting)  Post-op Assessment: Report given to RN and Post -op Vital signs reviewed and stable  Post vital signs: Reviewed and stable  Last Vitals:  Vitals Value Taken Time  BP    Temp    Pulse    Resp    SpO2      Last Pain:  Vitals:   10/25/17 1548  TempSrc:   PainSc: 2          Complications: No apparent anesthesia complications

## 2017-10-25 NOTE — Anesthesia Preprocedure Evaluation (Addendum)
Anesthesia Evaluation  Patient identified by MRN, date of birth, ID band Patient awake    Reviewed: Allergy & Precautions, NPO status , Patient's Chart, lab work & pertinent test results, Unable to perform ROS - Chart review onlyPreop documentation limited or incomplete due to emergent nature of procedure.  Airway Mallampati: II  TM Distance: >3 FB Neck ROM: Full    Dental  (+) Teeth Intact, Dental Advisory Given, Partial Upper   Pulmonary Current Smoker,    Pulmonary exam normal breath sounds clear to auscultation       Cardiovascular hypertension, + CAD and + Past MI  Normal cardiovascular exam+ dysrhythmias  Rhythm:Regular Rate:Normal     Neuro/Psych negative neurological ROS  negative psych ROS   GI/Hepatic negative GI ROS, Neg liver ROS,   Endo/Other  diabetes, Type 2  Renal/GU negative Renal ROS     Musculoskeletal negative musculoskeletal ROS (+)   Abdominal   Peds  Hematology negative hematology ROS (+)   Anesthesia Other Findings Day of surgery medications reviewed with the patient.  Reproductive/Obstetrics                            Anesthesia Physical Anesthesia Plan  ASA: III and emergent  Anesthesia Plan: General   Post-op Pain Management:    Induction: Intravenous  PONV Risk Score and Plan: 1  Airway Management Planned: Oral ETT  Additional Equipment: Arterial line, CVP, PA Cath, TEE and Ultrasound Guidance Line Placement  Intra-op Plan:   Post-operative Plan: Post-operative intubation/ventilation  Informed Consent: I have reviewed the patients History and Physical, chart, labs and discussed the procedure including the risks, benefits and alternatives for the proposed anesthesia with the patient or authorized representative who has indicated his/her understanding and acceptance.   Dental advisory given and Only emergency history available  Plan Discussed with:  CRNA  Anesthesia Plan Comments:         Anesthesia Quick Evaluation

## 2017-10-25 NOTE — ED Notes (Signed)
Cardiology repaged to Dr. Particia NearingHaviland

## 2017-10-25 NOTE — Brief Op Note (Signed)
10/25/2017  2:09 PM  PATIENT:  Eddie Black  57 y.o. male  PRE-OPERATIVE DIAGNOSIS:  Severe CAD  POST-OPERATIVE DIAGNOSIS:  Severe CAD  PROCEDURE:  Procedure(s): CORONARY ARTERY BYPASS GRAFTING (CABG) times three, WITH ENDOSCOPIC HARVESTING OF RIGHT SAPHENOUS VEIN (N/A) TRANSESOPHAGEAL ECHOCARDIOGRAM (TEE) (N/A)  LIMA to LAD SVG to PL SVG to Diag1  SURGEON:  Surgeon(s) and Role:    Kerin Perna* Van Trigt, Peter, MD - Primary  PHYSICIAN ASSISTANT:  Jari Favreessa Conte, PA-C   ANESTHESIA:   general  EBL:  600 mL   BLOOD ADMINISTERED:none  DRAINS: ROUTINE   LOCAL MEDICATIONS USED:  NONE  SPECIMEN:  No Specimen  DISPOSITION OF SPECIMEN:  N/A  COUNTS:  YES  DICTATION: .Dragon Dictation  PLAN OF CARE: Admit to inpatient   PATIENT DISPOSITION:  ICU - intubated and hemodynamically stable.   Delay start of Pharmacological VTE agent (>24hrs) due to surgical blood loss or risk of bleeding: yes
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
## 2017-10-25 NOTE — Anesthesia Procedure Notes (Signed)
Central Venous Catheter Insertion Performed by: Cecile Hearingurk, Stephen Edward, MD, anesthesiologist Start/End3/29/2019 4:50 PM, 10/25/2017 4:55 PM Patient location: Pre-op. Preanesthetic checklist: patient identified, IV checked, site marked, risks and benefits discussed, surgical consent, monitors and equipment checked, pre-op evaluation, timeout performed and anesthesia consent Hand hygiene performed  and maximum sterile barriers used  Total catheter length 100. PA cath was placed.Swan type:thermodilution PA Cath depth:49 Procedure performed without using ultrasound guided technique. Attempts: 1 Patient tolerated the procedure well with no immediate complications.

## 2017-10-25 NOTE — OR Nursing (Signed)
1637 IO needle removed from right shin. 4 x 4 Gauze  and pressure applied to site. Small amt of bleeding noted. No bruising or swelling at site.

## 2017-10-26 ENCOUNTER — Encounter (HOSPITAL_COMMUNITY): Payer: Self-pay | Admitting: *Deleted

## 2017-10-26 ENCOUNTER — Inpatient Hospital Stay (HOSPITAL_COMMUNITY): Payer: BLUE CROSS/BLUE SHIELD

## 2017-10-26 ENCOUNTER — Other Ambulatory Visit: Payer: Self-pay

## 2017-10-26 LAB — GLUCOSE, CAPILLARY
Glucose-Capillary: 160 mg/dL — ABNORMAL HIGH (ref 65–99)
Glucose-Capillary: 133 mg/dL — ABNORMAL HIGH (ref 65–99)
Glucose-Capillary: 169 mg/dL — ABNORMAL HIGH (ref 65–99)
Glucose-Capillary: 118 mg/dL — ABNORMAL HIGH (ref 65–99)
Glucose-Capillary: 114 mg/dL — ABNORMAL HIGH (ref 65–99)
Glucose-Capillary: 127 mg/dL — ABNORMAL HIGH (ref 65–99)
Glucose-Capillary: 133 mg/dL — ABNORMAL HIGH (ref 65–99)
Glucose-Capillary: 147 mg/dL — ABNORMAL HIGH (ref 65–99)
Glucose-Capillary: 121 mg/dL — ABNORMAL HIGH (ref 65–99)

## 2017-10-26 LAB — POCT I-STAT 3, ART BLOOD GAS (G3+)
Acid-Base Excess: 2 mmol/L (ref 0.0–2.0)
Acid-base deficit: 1 mmol/L (ref 0.0–2.0)
Acid-base deficit: 1 mmol/L (ref 0.0–2.0)
Acid-base deficit: 6 mmol/L — ABNORMAL HIGH (ref 0.0–2.0)
Acid-base deficit: 4 mmol/L — ABNORMAL HIGH (ref 0.0–2.0)
Bicarbonate: 24.2 mmol/L (ref 20.0–28.0)
Bicarbonate: 23.5 mmol/L (ref 20.0–28.0)
Bicarbonate: 18.3 mmol/L — ABNORMAL LOW (ref 20.0–28.0)
Bicarbonate: 19.8 mmol/L — ABNORMAL LOW (ref 20.0–28.0)
Bicarbonate: 26.3 mmol/L (ref 20.0–28.0)
O2 Saturation: 97 %
O2 Saturation: 96 %
O2 Saturation: 95 %
O2 Saturation: 93 %
O2 Saturation: 93 %
Patient temperature: 37.3
Patient temperature: 37.7
Patient temperature: 37.6
Patient temperature: 37.7
Patient temperature: 38
TCO2: 26 mmol/L (ref 22–32)
TCO2: 25 mmol/L (ref 22–32)
TCO2: 19 mmol/L — ABNORMAL LOW (ref 22–32)
TCO2: 21 mmol/L — ABNORMAL LOW (ref 22–32)
TCO2: 27 mmol/L (ref 22–32)
pCO2 arterial: 44.9 mmHg (ref 32.0–48.0)
pCO2 arterial: 36.6 mmHg (ref 32.0–48.0)
pCO2 arterial: 31.1 mmHg — ABNORMAL LOW (ref 32.0–48.0)
pCO2 arterial: 31.6 mmHg — ABNORMAL LOW (ref 32.0–48.0)
pCO2 arterial: 38.7 mmHg (ref 32.0–48.0)
pH, Arterial: 7.342 — ABNORMAL LOW (ref 7.350–7.450)
pH, Arterial: 7.418 (ref 7.350–7.450)
pH, Arterial: 7.381 (ref 7.350–7.450)
pH, Arterial: 7.408 (ref 7.350–7.450)
pH, Arterial: 7.445 (ref 7.350–7.450)
pO2, Arterial: 95 mmHg (ref 83.0–108.0)
pO2, Arterial: 83 mmHg (ref 83.0–108.0)
pO2, Arterial: 80 mmHg — ABNORMAL LOW (ref 83.0–108.0)
pO2, Arterial: 69 mmHg — ABNORMAL LOW (ref 83.0–108.0)
pO2, Arterial: 69 mmHg — ABNORMAL LOW (ref 83.0–108.0)

## 2017-10-26 LAB — CREATININE, SERUM
Creatinine, Ser: 0.76 mg/dL (ref 0.61–1.24)
GFR calc Af Amer: >60 mL/min (ref >60)

## 2017-10-26 LAB — BASIC METABOLIC PANEL
ANION GAP: 6 (ref 5–15)
BUN: 11 mg/dL (ref 6–20)
CALCIUM: 7.8 mg/dL — AB (ref 8.9–10.3)
CO2: 25 mmol/L (ref 22–32)
Chloride: 108 mmol/L (ref 101–111)
Creatinine, Ser: 0.84 mg/dL (ref 0.61–1.24)
GFR calc non Af Amer: >60 mL/min (ref >60)
GLUCOSE: 135 mg/dL — AB (ref 65–99)
POTASSIUM: 3.8 mmol/L (ref 3.5–5.1)
SODIUM: 139 mmol/L (ref 135–145)

## 2017-10-26 LAB — BPAM FFP
BLOOD PRODUCT EXPIRATION DATE: 201903302359
BLOOD PRODUCT EXPIRATION DATE: 201903302359
ISSUE DATE / TIME: 201903292016
ISSUE DATE / TIME: 201903292016
UNIT TYPE AND RH: 6200
Unit Type and Rh: 5100

## 2017-10-26 LAB — PREPARE FRESH FROZEN PLASMA
UNIT DIVISION: 0
Unit division: 0

## 2017-10-26 LAB — POCT I-STAT, CHEM 8
BUN: 8 mg/dL (ref 6–20)
Calcium, Ion: 1.13 mmol/L — ABNORMAL LOW (ref 1.15–1.40)
Chloride: 101 mmol/L (ref 101–111)
Creatinine, Ser: 0.7 mg/dL (ref 0.61–1.24)
Glucose, Bld: 151 mg/dL — ABNORMAL HIGH (ref 65–99)
HCT: 27 % — ABNORMAL LOW (ref 39.0–52.0)
Hemoglobin: 9.2 g/dL — ABNORMAL LOW (ref 13.0–17.0)
Potassium: 3.4 mmol/L — ABNORMAL LOW (ref 3.5–5.1)
Sodium: 140 mmol/L (ref 135–145)
TCO2: 25 mmol/L (ref 22–32)

## 2017-10-26 LAB — CBC
HCT: 30 % — ABNORMAL LOW (ref 39.0–52.0)
HCT: 29.1 % — ABNORMAL LOW (ref 39.0–52.0)
Hemoglobin: 9.7 g/dL — ABNORMAL LOW (ref 13.0–17.0)
Hemoglobin: 9.5 g/dL — ABNORMAL LOW (ref 13.0–17.0)
MCH: 29.8 pg (ref 26.0–34.0)
MCH: 29.8 pg (ref 26.0–34.0)
MCHC: 32.3 g/dL (ref 30.0–36.0)
MCHC: 32.6 g/dL (ref 30.0–36.0)
MCV: 92 fL (ref 78.0–100.0)
MCV: 91.2 fL (ref 78.0–100.0)
PLATELETS: 189 10*3/uL (ref 150–400)
Platelets: 210 10*3/uL (ref 150–400)
RBC: 3.26 MIL/uL — AB (ref 4.22–5.81)
RBC: 3.19 MIL/uL — ABNORMAL LOW (ref 4.22–5.81)
RDW: 13.3 % (ref 11.5–15.5)
RDW: 13.1 % (ref 11.5–15.5)
WBC: 13.8 10*3/uL — AB (ref 4.0–10.5)
WBC: 12.1 10*3/uL — AB (ref 4.0–10.5)

## 2017-10-26 LAB — MAGNESIUM
MAGNESIUM: 2.4 mg/dL (ref 1.7–2.4)
MAGNESIUM: 1.9 mg/dL (ref 1.7–2.4)

## 2017-10-26 LAB — BLOOD PRODUCT ORDER (VERBAL) VERIFICATION

## 2017-10-26 LAB — MRSA PCR SCREENING: MRSA by PCR: NEGATIVE

## 2017-10-26 MED ORDER — ENOXAPARIN SODIUM 40 MG/0.4ML ~~LOC~~ SOLN
40.0000 mg | SUBCUTANEOUS | Status: DC
  Administered 2017-10-26 – 2017-11-02 (×7): 40 mg via SUBCUTANEOUS
  Filled 2017-10-26 (×8): qty 0.4

## 2017-10-26 MED ORDER — ORAL CARE MOUTH RINSE
15.0000 mL | Freq: Two times a day (BID) | OROMUCOSAL | Status: DC
  Administered 2017-10-26 – 2017-11-01 (×8): 15 mL via OROMUCOSAL

## 2017-10-26 MED ORDER — PNEUMOCOCCAL VAC POLYVALENT 25 MCG/0.5ML IJ INJ
0.5000 mL | INJECTION | INTRAMUSCULAR | Status: AC | PRN
  Administered 2017-11-03: 0.5 mL via INTRAMUSCULAR
  Filled 2017-10-26: qty 0.5

## 2017-10-26 MED ORDER — POTASSIUM CHLORIDE 10 MEQ/50ML IV SOLN
10.0000 meq | INTRAVENOUS | Status: AC | PRN
  Administered 2017-10-26 (×3): 10 meq via INTRAVENOUS
  Filled 2017-10-26 (×3): qty 50

## 2017-10-26 MED ORDER — FUROSEMIDE 10 MG/ML IJ SOLN
20.0000 mg | Freq: Two times a day (BID) | INTRAMUSCULAR | Status: DC
  Administered 2017-10-26 (×2): 20 mg via INTRAVENOUS
  Filled 2017-10-26 (×2): qty 2

## 2017-10-26 MED ORDER — INSULIN DETEMIR 100 UNIT/ML ~~LOC~~ SOLN
15.0000 [IU] | Freq: Two times a day (BID) | SUBCUTANEOUS | Status: DC
  Administered 2017-10-26 – 2017-10-27 (×4): 15 [IU] via SUBCUTANEOUS
  Filled 2017-10-26 (×5): qty 0.15

## 2017-10-26 MED ORDER — CHLORHEXIDINE GLUCONATE CLOTH 2 % EX PADS
6.0000 | MEDICATED_PAD | Freq: Every day | CUTANEOUS | Status: DC
  Administered 2017-10-26 – 2017-11-01 (×7): 6 via TOPICAL

## 2017-10-26 MED ORDER — SODIUM BICARBONATE 8.4 % IV SOLN
50.0000 meq | Freq: Once | INTRAVENOUS | Status: AC
  Administered 2017-10-26: 50 meq via INTRAVENOUS

## 2017-10-26 MED ORDER — NOREPINEPHRINE BITARTRATE 1 MG/ML IV SOLN
0.0000 ug/min | INTRAVENOUS | Status: DC
  Administered 2017-10-26: 5 ug/min via INTRAVENOUS
  Filled 2017-10-26: qty 16

## 2017-10-26 MED ORDER — SODIUM CHLORIDE 0.9% FLUSH
10.0000 mL | INTRAVENOUS | Status: DC | PRN
  Administered 2017-11-02 (×2): 10 mL
  Filled 2017-10-26 (×2): qty 40

## 2017-10-26 MED ORDER — ORAL CARE MOUTH RINSE
15.0000 mL | Freq: Four times a day (QID) | OROMUCOSAL | Status: DC
  Administered 2017-10-26: 15 mL via OROMUCOSAL

## 2017-10-26 MED ORDER — INSULIN ASPART 100 UNIT/ML ~~LOC~~ SOLN
0.0000 [IU] | SUBCUTANEOUS | Status: DC
  Administered 2017-10-26: 2 [IU] via SUBCUTANEOUS
  Administered 2017-10-26: 4 [IU] via SUBCUTANEOUS
  Administered 2017-10-26 – 2017-10-27 (×4): 2 [IU] via SUBCUTANEOUS
  Administered 2017-10-27: 4 [IU] via SUBCUTANEOUS
  Administered 2017-10-27: 2 [IU] via SUBCUTANEOUS

## 2017-10-26 MED ORDER — INSULIN ASPART 100 UNIT/ML ~~LOC~~ SOLN: 0.0000 [IU] | SUBCUTANEOUS | Status: DC

## 2017-10-26 MED ORDER — VANCOMYCIN HCL IN DEXTROSE 1-5 GM/200ML-% IV SOLN
1000.0000 mg | Freq: Once | INTRAVENOUS | Status: AC
  Administered 2017-10-26: 1000 mg via INTRAVENOUS
  Filled 2017-10-26: qty 200

## 2017-10-26 MED ORDER — CHLORHEXIDINE GLUCONATE 0.12% ORAL RINSE (MEDLINE KIT)
15.0000 mL | Freq: Two times a day (BID) | OROMUCOSAL | Status: DC
  Administered 2017-10-26: 15 mL via OROMUCOSAL

## 2017-10-26 NOTE — Anesthesia Postprocedure Evaluation (Signed)
Anesthesia Post Note  Patient: Eddie Black  Procedure(s) Performed: CORONARY ARTERY BYPASS GRAFTING (CABG) times three, WITH ENDOSCOPIC HARVESTING OF RIGHT SAPHENOUS VEIN (N/A Chest) TRANSESOPHAGEAL ECHOCARDIOGRAM (TEE) (N/A )     Patient location during evaluation: SICU Anesthesia Type: General Level of consciousness: sedated and patient remains intubated per anesthesia plan Pain management: pain level controlled Vital Signs Assessment: post-procedure vital signs reviewed and stable Respiratory status: patient remains intubated per anesthesia plan and patient on ventilator - see flowsheet for VS Cardiovascular status: stable Postop Assessment: no apparent nausea or vomiting Anesthetic complications: no    Last Vitals:  Vitals:   10/25/17 2300 10/25/17 2315  BP: 115/80   Pulse: 89 89  Resp: 12 12  Temp: (!) 36.2 C (!) 36.3 C  SpO2: 100% 100%    Last Pain:  Vitals:   10/25/17 1548  TempSrc:   PainSc: 2                  Jordana Dugue COKER

## 2017-10-26 NOTE — Progress Notes (Signed)
1 Day Post-Op Procedure(s) (LRB): CORONARY ARTERY BYPASS GRAFTING (CABG) times three, WITH ENDOSCOPIC HARVESTING OF RIGHT SAPHENOUS VEIN (N/A) TRANSESOPHAGEAL ECHOCARDIOGRAM (TEE) (N/A) Subjective: Sedated on ventilator with adequate blood gas and chest x-ray fairly clear-will wean sedation and wean ventilator for extubation Hemodynamics stable after cardiac arrest and emergency CABG.  Norepinephrine at 7 mcg.  We will slowly wean balloon pump over next 24 hours Adequate urine output and renal function Objective: Vital signs in last 24 hours: Temp:  [96.3 F (35.7 C)-100 F (37.8 C)] 100 F (37.8 C) (03/30 1000) Pulse Rate:  [0-186] 89 (03/30 1000) Cardiac Rhythm: Atrial paced (03/30 1000) Resp:  [0-36] 17 (03/30 1000) BP: (78-152)/(38-95) 104/49 (03/30 1000) SpO2:  [0 %-100 %] 100 % (03/30 1000) Arterial Line BP: (85-231)/(17-69) 231/17 (03/30 1000) FiO2 (%):  [50 %] 50 % (03/30 1000) Weight:  [243 lb 6.2 oz (110.4 kg)-244 lb 3.2 oz (110.8 kg)] 244 lb 3.2 oz (110.8 kg) (03/30 0700)  Hemodynamic parameters for last 24 hours: PAP: (26-52)/(12-30) 29/17 CO:  [4.5 L/min-6.1 L/min] 6.1 L/min CI:  [1.9 L/min/m2-2.6 L/min/m2] 2.6 L/min/m2  Intake/Output from previous day: 03/29 0701 - 03/30 0700 In: 5624.1 [I.V.:3512.1; Blood:742; NG/GT:60; IV Piggyback:1250] Out: 4375 [Urine:3435; Blood:600; Chest Tube:340] Intake/Output this shift: Total I/O In: 552.3 [I.V.:382.3; NG/GT:20; IV Piggyback:150] Out: 395 [Urine:195; Emesis/NG output:50; Chest Tube:150]  Exam Sedated on ventilator Lungs clear Abdomen soft Sinus rhythm Extremities warm Minimal chest tube output  Lab Results: Recent Labs    10/25/17 2254 10/26/17 0431  WBC 15.2* 13.8*  HGB 10.1* 9.7*  HCT 30.7* 30.0*  PLT 206 210   BMET:  Recent Labs    10/25/17 1529  10/25/17 2117 10/26/17 0431  NA 138   < > 141 139  K 3.7   < > 4.9 3.8  CL 108   < > 107 108  CO2 22  --   --  25  GLUCOSE 163*   < > 150* 135*   BUN 13   < > 14 11  CREATININE 0.95   < > 0.70 0.84  CALCIUM 8.6*  --   --  7.8*   < > = values in this interval not displayed.    PT/INR:  Recent Labs    10/25/17 2254  LABPROT 17.0*  INR 1.40   ABG    Component Value Date/Time   PHART 7.342 (L) 10/26/2017 0448   HCO3 24.2 10/26/2017 0448   TCO2 26 10/26/2017 0448   ACIDBASEDEF 1.0 10/26/2017 0448   O2SAT 97.0 10/26/2017 0448   CBG (last 3)  Recent Labs    10/26/17 0602 10/26/17 0754 10/26/17 0901  GLUCAP 118* 127* 114*    Assessment/Plan: S/P Procedure(s) (LRB): CORONARY ARTERY BYPASS GRAFTING (CABG) times three, WITH ENDOSCOPIC HARVESTING OF RIGHT SAPHENOUS VEIN (N/A) TRANSESOPHAGEAL ECHOCARDIOGRAM (TEE) (N/A) Diuresis Diabetes control See progression orders Plan to extubate today, wean and remove balloon pump tomorrow   LOS: 1 day    Kathlee Nationseter Van Trigt III 10/26/2017

## 2017-10-26 NOTE — Procedures (Signed)
Extubation Procedure Note  Patient Details:   Name: Eddie Black DOB: 1961-02-28 MRN: [id number]   Airway Documentation:     Evaluation  O2 sats: stable throughout Complications: No apparent complications Patient did tolerate procedure well. Bilateral Breath Sounds: Clear, Diminished   Yes  VC 900 mls, NIF -20. Pt extubated to a 4lpm [place] tolerating well  Melanee Spryelson, Ronnesha Mester Lawson 10/26/2017, 11:22 AM
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
## 2017-10-27 ENCOUNTER — Inpatient Hospital Stay (HOSPITAL_COMMUNITY): Payer: BLUE CROSS/BLUE SHIELD

## 2017-10-27 LAB — GLUCOSE, CAPILLARY
Glucose-Capillary: 127 mg/dL — ABNORMAL HIGH (ref 65–99)
Glucose-Capillary: 129 mg/dL — ABNORMAL HIGH (ref 65–99)
Glucose-Capillary: 111 mg/dL — ABNORMAL HIGH (ref 65–99)
Glucose-Capillary: 174 mg/dL — ABNORMAL HIGH (ref 65–99)
Glucose-Capillary: 132 mg/dL — ABNORMAL HIGH (ref 65–99)
Glucose-Capillary: 110 mg/dL — ABNORMAL HIGH (ref 65–99)

## 2017-10-27 LAB — POCT ACTIVATED CLOTTING TIME: Activated Clotting Time: 131 seconds

## 2017-10-27 LAB — POCT I-STAT, CHEM 8
BUN: 12 mg/dL (ref 6–20)
Calcium, Ion: 1.19 mmol/L (ref 1.15–1.40)
Chloride: 97 mmol/L — ABNORMAL LOW (ref 101–111)
Creatinine, Ser: 0.7 mg/dL (ref 0.61–1.24)
Glucose, Bld: 171 mg/dL — ABNORMAL HIGH (ref 65–99)
HCT: 25 % — ABNORMAL LOW (ref 39.0–52.0)
Hemoglobin: 8.5 g/dL — ABNORMAL LOW (ref 13.0–17.0)
Potassium: 3.7 mmol/L (ref 3.5–5.1)
Sodium: 137 mmol/L (ref 135–145)
TCO2: 25 mmol/L (ref 22–32)

## 2017-10-27 LAB — COOXEMETRY PANEL
Carboxyhemoglobin: 1.2 % (ref 0.5–1.5)
Methemoglobin: 0.9 % (ref 0.0–1.5)
O2 Saturation: 53 %
Total hemoglobin: 8.9 g/dL — ABNORMAL LOW (ref 12.0–16.0)

## 2017-10-27 LAB — CBC
HCT: 28.3 % — ABNORMAL LOW (ref 39.0–52.0)
Hemoglobin: 9.2 g/dL — ABNORMAL LOW (ref 13.0–17.0)
MCH: 30 pg (ref 26.0–34.0)
MCHC: 32.5 g/dL (ref 30.0–36.0)
MCV: 92.2 fL (ref 78.0–100.0)
Platelets: 168 10*3/uL (ref 150–400)
RBC: 3.07 MIL/uL — ABNORMAL LOW (ref 4.22–5.81)
RDW: 13.6 % (ref 11.5–15.5)
WBC: 14.3 10*3/uL — ABNORMAL HIGH (ref 4.0–10.5)

## 2017-10-27 LAB — BASIC METABOLIC PANEL
Anion gap: 7 (ref 5–15)
BUN: 8 mg/dL (ref 6–20)
CO2: 26 mmol/L (ref 22–32)
Calcium: 7.8 mg/dL — ABNORMAL LOW (ref 8.9–10.3)
Chloride: 104 mmol/L (ref 101–111)
Creatinine, Ser: 0.83 mg/dL (ref 0.61–1.24)
GFR calc Af Amer: >60 mL/min (ref >60)
GFR calc non Af Amer: >60 mL/min (ref >60)
Glucose, Bld: 133 mg/dL — ABNORMAL HIGH (ref 65–99)
Potassium: 3.3 mmol/L — ABNORMAL LOW (ref 3.5–5.1)
Sodium: 137 mmol/L (ref 135–145)

## 2017-10-27 LAB — PROTIME-INR
INR: 1.38
Prothrombin Time: 16.8 seconds — ABNORMAL HIGH (ref 11.4–15.2)

## 2017-10-27 LAB — APTT: aPTT: 53 seconds — ABNORMAL HIGH (ref 24–36)

## 2017-10-27 MED ORDER — POTASSIUM CHLORIDE 10 MEQ/50ML IV SOLN
10.0000 meq | INTRAVENOUS | Status: AC
  Administered 2017-10-27 (×3): 10 meq via INTRAVENOUS
  Filled 2017-10-27 (×3): qty 50

## 2017-10-27 MED ORDER — POTASSIUM CHLORIDE 10 MEQ/50ML IV SOLN
10.0000 meq | INTRAVENOUS | Status: AC
  Administered 2017-10-27 (×3): 10 meq via INTRAVENOUS
  Filled 2017-10-27: qty 50

## 2017-10-27 MED ORDER — DM-GUAIFENESIN ER 30-600 MG PO TB12
1.0000 | ORAL_TABLET | Freq: Two times a day (BID) | ORAL | Status: DC | PRN
  Administered 2017-10-27: 1 via ORAL
  Filled 2017-10-27: qty 1

## 2017-10-27 MED ORDER — FUROSEMIDE 10 MG/ML IJ SOLN
20.0000 mg | Freq: Four times a day (QID) | INTRAMUSCULAR | Status: DC
  Administered 2017-10-27 – 2017-10-28 (×4): 20 mg via INTRAVENOUS
  Filled 2017-10-27 (×3): qty 2

## 2017-10-27 MED ORDER — POTASSIUM CHLORIDE 10 MEQ/50ML IV SOLN
10.0000 meq | INTRAVENOUS | Status: AC
  Administered 2017-10-27 (×2): 10 meq via INTRAVENOUS
  Filled 2017-10-27: qty 50

## 2017-10-27 MED ORDER — POTASSIUM CHLORIDE 10 MEQ/50ML IV SOLN: 10.0000 meq | INTRAVENOUS | Status: DC

## 2017-10-27 NOTE — Progress Notes (Signed)
Rt lateral ct removed per md order, pt tolerated well, vss

## 2017-10-27 NOTE — Progress Notes (Signed)
Anesthesiology Follow-up:  Awake and alert, neuro intact, in good spirits, having some incisional pain.   VS: T- 37.4 BP- 136/73 HR 91 RR- 95 (SR) RR- 18 O2 Sat 98% on 4L [place]  K-3.3 glucose-133 BUN/Cr. 8/0.83 H/H- 9.2/28.3 Platelets- 168,000  Extubated at 11:30 AM on POD #1  IABP removed this morning.  57 year old male 2 days S/P CABG X 3 following resuscitation from V.Fib arrest. Doing well with no apparent anesthetic complications.  Eddie Black

## 2017-10-27 NOTE — Progress Notes (Signed)
2 Days Post-Op Procedure(s) (LRB): CORONARY ARTERY BYPASS GRAFTING (CABG) times three, WITH ENDOSCOPIC HARVESTING OF RIGHT SAPHENOUS VEIN (N/A) TRANSESOPHAGEAL ECHOCARDIOGRAM (TEE) (N/A) Subjective: Progressing- IABP out and OOB to chair todat No ventricular arrhythmias  Objective: Vital signs in last 24 hours: Temp:  [98.6 F (37 C)-101.3 F (38.5 C)] 99.3 F (37.4 C) (03/31 0900) Pulse Rate:  [38-90] 85 (03/31 0900) Cardiac Rhythm: Normal sinus rhythm (03/31 0800) Resp:  [14-28] 23 (03/31 0900) BP: (94-134)/(48-102) 112/63 (03/31 0830) SpO2:  [84 %-100 %] 99 % (03/31 0900) Arterial Line BP: (92-231)/(17-109) 155/69 (03/31 0900) FiO2 (%):  [40 %-50 %] 40 % (03/30 1045) Weight:  [242 lb 4.6 oz (109.9 kg)] 242 lb 4.6 oz (109.9 kg) (03/31 0600)  Hemodynamic parameters for last 24 hours: PAP: (21-53)/(8-23) 36/17 CO:  [6.7 L/min-9 L/min] 6.7 L/min CI:  [2.9 L/min/m2-3.9 L/min/m2] 2.9 L/min/m2  Intake/Output from previous day: 03/30 0701 - 03/31 0700 In: 3125.1 [P.O.:480; I.V.:1875.1; NG/GT:20; IV Piggyback:750] Out: 3475 [Urine:2715; Emesis/NG output:50; Chest Tube:710] Intake/Output this shift: Total I/O In: 218 [I.V.:118; IV Piggyback:100] Out: 485 [Urine:475; Chest Tube:10]       Exam    General- alert and comfortable    Neck- no JVD, no cervical adenopathy palpable, no carotid bruit   Lungs- clear without rales, wheezes   Cor- regular rate and rhythm, no murmur , gallop   Abdomen- soft, non-tender   Extremities - warm, non-tender, minimal edema   Neuro- oriented, appropriate, no focal weakness   Lab Results: Recent Labs    10/26/17 1538 10/26/17 1541 10/27/17 0351  WBC 12.1*  --  14.3*  HGB 9.5* 9.2* 9.2*  HCT 29.1* 27.0* 28.3*  PLT 189  --  168   BMET:  Recent Labs    10/26/17 0431  10/26/17 1541 10/27/17 0351  NA 139  --  140 137  K 3.8  --  3.4* 3.3*  CL 108  --  101 104  CO2 25  --   --  26  GLUCOSE 135*  --  151* 133*  BUN 11  --  8 8   CREATININE 0.84   < > 0.70 0.83  CALCIUM 7.8*  --   --  7.8*   < > = values in this interval not displayed.    PT/INR:  Recent Labs    10/27/17 0351  LABPROT 16.8*  INR 1.38   ABG    Component Value Date/Time   PHART 7.445 10/26/2017 1545   HCO3 26.3 10/26/2017 1545   TCO2 27 10/26/2017 1545   ACIDBASEDEF 4.0 (H) 10/26/2017 1421   O2SAT 93.0 10/26/2017 1545   CBG (last 3)  Recent Labs    10/26/17 2307 10/27/17 0417 10/27/17 0720  GLUCAP 121* 127* 129*    Assessment/Plan: S/P Procedure(s) (LRB): CORONARY ARTERY BYPASS GRAFTING (CABG) times three, WITH ENDOSCOPIC HARVESTING OF RIGHT SAPHENOUS VEIN (N/A) TRANSESOPHAGEAL ECHOCARDIOGRAM (TEE) (N/A) Mobilize Diuresis Diabetes control DC IABP- cont milronone, check co-ox   LOS: 2 days    Eddie Black 10/27/2017

## 2017-10-27 NOTE — Progress Notes (Signed)
CT surgery p.m. Rounds  Doing well today, balloon pump removed Patient up to chair and took a short walk in the room Maintaining sinus rhythm Continue low-dose milrinone for Co-ox 55%

## 2017-10-27 NOTE — Progress Notes (Signed)
Called for IABP removal from RFA.R dp+ faint with doppler pre removal, ACT reported 131, explained procedure to patient, removed dressing fromrfa site- level 0, 7.5 fr sheath removed from rfa site, manual pressure held x 45 min- hemostasis achieved- R DP+ with doppler throughout hold, v/s/s: HR 80s bp 145/70 ish throughout hold. Pt. Given instruction if he laughs, coughs, sneezes or bares down to hold rfa pressure similar to how I held. Pt. Hand guided to rfa site and pt. Verbally as well as demonstrated understanding of those instructions.  Pt. Made aware to keep leg straight x 6 hrs. Pt. Verbally acknowledges understanding of same.   Pt. RN Celinia reviewed rfa site after hold. Site soft, not edema, no ecchymosis, level 0. A dry, sterile dressing applied to rfa site. Pt. Left in the care of Celina,RN in stable condition.

## 2017-10-28 ENCOUNTER — Inpatient Hospital Stay: Payer: Self-pay

## 2017-10-28 ENCOUNTER — Inpatient Hospital Stay (HOSPITAL_COMMUNITY): Payer: BLUE CROSS/BLUE SHIELD

## 2017-10-28 ENCOUNTER — Encounter (HOSPITAL_COMMUNITY): Payer: Self-pay | Admitting: Interventional Cardiology

## 2017-10-28 DIAGNOSIS — J441 Chronic obstructive pulmonary disease with (acute) exacerbation: Secondary | ICD-10-CM

## 2017-10-28 DIAGNOSIS — Z951 Presence of aortocoronary bypass graft: Secondary | ICD-10-CM

## 2017-10-28 DIAGNOSIS — J9601 Acute respiratory failure with hypoxia: Secondary | ICD-10-CM

## 2017-10-28 DIAGNOSIS — J690 Pneumonitis due to inhalation of food and vomit: Secondary | ICD-10-CM

## 2017-10-28 LAB — POCT I-STAT 3, ART BLOOD GAS (G3+)
Acid-Base Excess: 1 mmol/L (ref 0.0–2.0)
Acid-Base Excess: 2 mmol/L (ref 0.0–2.0)
Acid-Base Excess: 4 mmol/L — ABNORMAL HIGH (ref 0.0–2.0)
Bicarbonate: 25.8 mmol/L (ref 20.0–28.0)
Bicarbonate: 26.1 mmol/L (ref 20.0–28.0)
Bicarbonate: 27.8 mmol/L (ref 20.0–28.0)
O2 Saturation: 81 %
O2 Saturation: 87 %
O2 Saturation: 91 %
Patient temperature: 98.9
Patient temperature: 98.9
Patient temperature: 98.9
TCO2: 27 mmol/L (ref 22–32)
TCO2: 27 mmol/L (ref 22–32)
TCO2: 29 mmol/L (ref 22–32)
pCO2 arterial: 38.7 mmHg (ref 32.0–48.0)
pCO2 arterial: 39.1 mmHg (ref 32.0–48.0)
pCO2 arterial: 39.3 mmHg (ref 32.0–48.0)
pH, Arterial: 7.428 (ref 7.350–7.450)
pH, Arterial: 7.438 (ref 7.350–7.450)
pH, Arterial: 7.459 — ABNORMAL HIGH (ref 7.350–7.450)
pO2, Arterial: 45 mmHg — ABNORMAL LOW (ref 83.0–108.0)
pO2, Arterial: 52 mmHg — ABNORMAL LOW (ref 83.0–108.0)
pO2, Arterial: 58 mmHg — ABNORMAL LOW (ref 83.0–108.0)

## 2017-10-28 LAB — COMPREHENSIVE METABOLIC PANEL
ALT: 52 U/L (ref 17–63)
AST: 25 U/L (ref 15–41)
Albumin: 2.8 g/dL — ABNORMAL LOW (ref 3.5–5.0)
Alkaline Phosphatase: 49 U/L (ref 38–126)
Anion gap: 8 (ref 5–15)
BUN: 14 mg/dL (ref 6–20)
CO2: 28 mmol/L (ref 22–32)
Calcium: 8 mg/dL — ABNORMAL LOW (ref 8.9–10.3)
Chloride: 99 mmol/L — ABNORMAL LOW (ref 101–111)
Creatinine, Ser: 0.88 mg/dL (ref 0.61–1.24)
GFR calc Af Amer: >60 mL/min (ref >60)
GFR calc non Af Amer: >60 mL/min (ref >60)
Glucose, Bld: 129 mg/dL — ABNORMAL HIGH (ref 65–99)
Potassium: 3.9 mmol/L (ref 3.5–5.1)
Sodium: 135 mmol/L (ref 135–145)
Total Bilirubin: 0.8 mg/dL (ref 0.3–1.2)
Total Protein: 5 g/dL — ABNORMAL LOW (ref 6.5–8.1)

## 2017-10-28 LAB — CBC
HCT: 27.1 % — ABNORMAL LOW (ref 39.0–52.0)
Hemoglobin: 9 g/dL — ABNORMAL LOW (ref 13.0–17.0)
MCH: 30.8 pg (ref 26.0–34.0)
MCHC: 33.2 g/dL (ref 30.0–36.0)
MCV: 92.8 fL (ref 78.0–100.0)
Platelets: 154 10*3/uL (ref 150–400)
RBC: 2.92 MIL/uL — ABNORMAL LOW (ref 4.22–5.81)
RDW: 13.7 % (ref 11.5–15.5)
WBC: 13.5 10*3/uL — ABNORMAL HIGH (ref 4.0–10.5)

## 2017-10-28 LAB — POCT I-STAT, CHEM 8
BUN: 19 mg/dL (ref 6–20)
Calcium, Ion: 1.12 mmol/L — ABNORMAL LOW (ref 1.15–1.40)
Chloride: 96 mmol/L — ABNORMAL LOW (ref 101–111)
Creatinine, Ser: 0.7 mg/dL (ref 0.61–1.24)
Glucose, Bld: 163 mg/dL — ABNORMAL HIGH (ref 65–99)
HCT: 28 % — ABNORMAL LOW (ref 39.0–52.0)
Hemoglobin: 9.5 g/dL — ABNORMAL LOW (ref 13.0–17.0)
Potassium: 4.1 mmol/L (ref 3.5–5.1)
Sodium: 134 mmol/L — ABNORMAL LOW (ref 135–145)
TCO2: 27 mmol/L (ref 22–32)

## 2017-10-28 LAB — GLUCOSE, CAPILLARY
Glucose-Capillary: 161 mg/dL — ABNORMAL HIGH (ref 65–99)
Glucose-Capillary: 107 mg/dL — ABNORMAL HIGH (ref 65–99)
Glucose-Capillary: 120 mg/dL — ABNORMAL HIGH (ref 65–99)
Glucose-Capillary: 152 mg/dL — ABNORMAL HIGH (ref 65–99)
Glucose-Capillary: 170 mg/dL — ABNORMAL HIGH (ref 65–99)

## 2017-10-28 LAB — POCT I-STAT 4, (NA,K, GLUC, HGB,HCT)
Glucose, Bld: 126 mg/dL — ABNORMAL HIGH (ref 65–99)
HCT: 30 % — ABNORMAL LOW (ref 39.0–52.0)
Hemoglobin: 10.2 g/dL — ABNORMAL LOW (ref 13.0–17.0)
Potassium: 4 mmol/L (ref 3.5–5.1)
Sodium: 143 mmol/L (ref 135–145)

## 2017-10-28 LAB — COOXEMETRY PANEL
Carboxyhemoglobin: 1 % (ref 0.5–1.5)
Methemoglobin: 1.6 % — ABNORMAL HIGH (ref 0.0–1.5)
O2 Saturation: 65.7 %
Total hemoglobin: 9.1 g/dL — ABNORMAL LOW (ref 12.0–16.0)

## 2017-10-28 MED ORDER — SODIUM CHLORIDE 0.9% FLUSH: 10.0000 mL | INTRAVENOUS | Status: DC | PRN

## 2017-10-28 MED ORDER — GLUCERNA SHAKE PO LIQD
237.0000 mL | Freq: Three times a day (TID) | ORAL | Status: DC
  Administered 2017-10-29 – 2017-11-03 (×8): 237 mL via ORAL

## 2017-10-28 MED ORDER — CHLORHEXIDINE GLUCONATE CLOTH 2 % EX PADS
6.0000 | MEDICATED_PAD | Freq: Every day | CUTANEOUS | Status: DC
  Administered 2017-10-28 – 2017-10-29 (×2): 6 via TOPICAL

## 2017-10-28 MED ORDER — POTASSIUM CHLORIDE CRYS ER 20 MEQ PO TBCR
20.0000 meq | EXTENDED_RELEASE_TABLET | Freq: Once | ORAL | Status: AC
  Administered 2017-10-28: 20 meq via ORAL
  Filled 2017-10-28: qty 1

## 2017-10-28 MED ORDER — SODIUM CHLORIDE 0.9% FLUSH
10.0000 mL | Freq: Two times a day (BID) | INTRAVENOUS | Status: DC
  Administered 2017-10-28 – 2017-10-29 (×2): 10 mL

## 2017-10-28 MED ORDER — FUROSEMIDE 10 MG/ML IJ SOLN
40.0000 mg | Freq: Once | INTRAMUSCULAR | Status: AC
  Administered 2017-10-28: 40 mg via INTRAVENOUS
  Filled 2017-10-28: qty 4

## 2017-10-28 MED ORDER — INSULIN DETEMIR 100 UNIT/ML ~~LOC~~ SOLN
12.0000 [IU] | Freq: Two times a day (BID) | SUBCUTANEOUS | Status: DC
  Administered 2017-10-28 – 2017-10-29 (×4): 12 [IU] via SUBCUTANEOUS
  Filled 2017-10-28 (×5): qty 0.12

## 2017-10-28 MED ORDER — PIPERACILLIN-TAZOBACTAM 3.375 G IVPB
3.3750 g | Freq: Three times a day (TID) | INTRAVENOUS | Status: DC
  Administered 2017-10-28 – 2017-11-01 (×11): 3.375 g via INTRAVENOUS
  Filled 2017-10-28 (×13): qty 50

## 2017-10-28 MED ORDER — METHYLPREDNISOLONE SODIUM SUCC 125 MG IJ SOLR
125.0000 mg | Freq: Once | INTRAMUSCULAR | Status: AC
  Administered 2017-10-28: 125 mg via INTRAVENOUS
  Filled 2017-10-28: qty 2

## 2017-10-28 MED ORDER — SODIUM CHLORIDE 0.9 % IV SOLN: INTRAVENOUS | Status: DC | PRN

## 2017-10-28 MED ORDER — FUROSEMIDE 10 MG/ML IJ SOLN
40.0000 mg | Freq: Two times a day (BID) | INTRAMUSCULAR | Status: DC
  Administered 2017-10-28 – 2017-11-01 (×10): 40 mg via INTRAVENOUS
  Filled 2017-10-28 (×10): qty 4

## 2017-10-28 MED FILL — Lidocaine HCl Local Inj 1%: INTRAMUSCULAR | Qty: 20 | Status: AC

## 2017-10-28 MED FILL — Potassium Chloride Inj 2 mEq/ML: INTRAVENOUS | Qty: 40 | Status: AC

## 2017-10-28 MED FILL — Magnesium Sulfate Inj 50%: INTRAMUSCULAR | Qty: 10 | Status: AC

## 2017-10-28 MED FILL — Heparin Sodium (Porcine) Inj 1000 Unit/ML: INTRAMUSCULAR | Qty: 30 | Status: AC

## 2017-10-28 MED FILL — Dexmedetomidine HCl in NaCl 0.9% IV Soln 400 MCG/100ML: INTRAVENOUS | Qty: 100 | Status: AC

## 2017-10-28 NOTE — Progress Notes (Signed)
Pt in respiratory distress. Placed on bipap 14/8 100% per Dr Donata ClayVan Trigt. Xopenex treatment given. Breath sounds are tight with decreased aeration throughout. RN at bedside.

## 2017-10-28 NOTE — Progress Notes (Signed)
PVT paged with abg results. Ph 7.4, CO2 39, PO2 44, Bicarb 25.8. MD verbal order for stat consult to CCM for possible intubation. MD verbal order to start fortez. Will page CCM Aljishi, MD. MD to bedside to evaluate. Will continue to monitor closely.    Delories HeinzMelissa Rhian Funari, RN

## 2017-10-28 NOTE — Progress Notes (Signed)
A-line insertion attempted multiple times by multiple RTs. Unable to obtain placement. Patient possibly clotting and obstructing catheter. RN is contacting CCM for placement of a-line.

## 2017-10-28 NOTE — Progress Notes (Signed)
Ambulated patient at 11:30 30 feet with avasys walker. Pt started coughing and O2 sats went to 60's. Pt wheeled back to room, put on non re-breather mask, MD paged, respiratory called. HR sinus tach 115, RR 39's, O2 sat on non re-breather in 70's. BP 151/67. Verbal order from Dr. Donata ClayVan Trigt xopenex treatment, blood gas stat, portable chest x-ray, and place the patient on Bi-Pap. 150 ml dark red output from chest tube. Will continue to monitor closely.   Delories HeinzMelissa Lorianne Malbrough, RN

## 2017-10-28 NOTE — Progress Notes (Signed)
      [address]       [place] [id number]             [phone]      Resting on BIPAP currently  BP 120/67   Pulse 94   Temp 98.9 F (37.2 C) (Oral)   Resp (!) 23   Ht 6\' 1"  (1.854 m)   Wt 241 lb 2.9 oz (109.4 kg)   SpO2 99%   BMI 31.82 kg/m   Intake/Output Summary (Last 24 hours) at 10/28/2017 1815 Last data filed at 10/28/2017 1638 Gross per 24 hour  Intake 298.93 ml  Output 2860 ml  Net -2561.07 ml   In SR, diuresing well Creatinine 0.7, K4.1  Viviann SpareSteven C. Dorris FetchHendrickson, MD [facility] [phone]

## 2017-10-28 NOTE — Progress Notes (Signed)
Pharmacy Antibiotic Note  Eddie Black is a 57 y.o. male admitted on 10/25/2017 with aspiration  pneumonia.  Pharmacy has been consulted for Zosyn dosing. WBC 13.5, afebrile, CXR- consolidation in medial left base. Scr 0.88.   Plan: Zosyn 3.375g IV q8h (4 hour infusion).  Monitor for Scr, clinical resolution. F/u de-escalation plan/LOT   Height: 6\' 1"  (185.4 cm) Weight: 241 lb 2.9 oz (109.4 kg) IBW/kg (Calculated) : 79.9  Temp (24hrs), Avg:98.8 F (37.1 C), Min:98.3 F (36.8 C), Max:99.3 F (37.4 C)  Recent Labs  Lab 10/25/17 2254 10/26/17 0431 10/26/17 1538 10/26/17 1541 10/27/17 0351 10/27/17 1657 10/28/17 0332  WBC 15.2* 13.8* 12.1*  --  14.3*  --  13.5*  CREATININE  --  0.84 0.76 0.70 0.83 0.70 0.88    Estimated Creatinine Clearance: 123 mL/min (by C-G formula based on SCr of 0.88 mg/dL).    No Known Allergies  Antimicrobials this admission: Cefazolin 3/30 >> 3/31 Vancomycin 3/30 >> 3/30 Zosyn 4/1>>   Thank you for allowing pharmacy to be a part of this patient's care.  Rite AidWhitney Ariadna Black. PharmD Candidate 10/28/2017 1:47 PM

## 2017-10-28 NOTE — Progress Notes (Addendum)
      [address]       [place] [id number]             [phone]        3 Days Post-Op Procedure(s) (LRB): CORONARY ARTERY BYPASS GRAFTING (CABG) times three, WITH ENDOSCOPIC HARVESTING OF RIGHT SAPHENOUS VEIN (N/A) TRANSESOPHAGEAL ECHOCARDIOGRAM (TEE) (N/A)  Subjective: Patient without specific complaints this am. He is passing a little flatus but no bowel movement yet.  Objective: Vital signs in last 24 hours: Temp:  [98.3 F (36.8 C)-99.9 F (37.7 C)] 98.3 F (36.8 C) (03/31 2330) Pulse Rate:  [75-120] 84 (04/01 0700) Cardiac Rhythm: Normal sinus rhythm (04/01 0100) Resp:  [11-26] 15 (04/01 0700) BP: (112-170)/(59-90) 117/65 (04/01 0700) SpO2:  [90 %-99 %] 92 % (04/01 0700) Arterial Line BP: (78-183)/(61-83) 152/69 (03/31 1800) Weight:  [241 lb 2.9 oz (109.4 kg)] 241 lb 2.9 oz (109.4 kg) (04/01 0600)  Pre op weight 110.4 kg Current Weight  10/28/17 241 lb 2.9 oz (109.4 kg)    Hemodynamic parameters for last 24 hours: PAP: (25-46)/(11-27) 31/19 CO:  [8 L/min] 8 L/min CI:  [3.4 L/min/m2] 3.4 L/min/m2  Intake/Output from previous day: 03/31 0701 - 04/01 0700 In: 1713 [P.O.:660; I.V.:503; IV Piggyback:550] Out: 3670 [Urine:3400; Chest Tube:270]   Physical Exam:  Cardiovascular: RRR Pulmonary: Diminished at bases Abdomen: Soft, non tender, sporadic bowel sounds present. Extremities: Mild bilateral lower extremity edema. Wounds: Aquacel intact on sternum. RLE wound is clean and dry.   Chest tubes: no air leak  Lab Results: CBC: Recent Labs    10/27/17 0351 10/27/17 1657 10/28/17 0332  WBC 14.3*  --  13.5*  HGB 9.2* 8.5* 9.0*  HCT 28.3* 25.0* 27.1*  PLT 168  --  154   BMET:  Recent Labs    10/27/17 0351 10/27/17 1657 10/28/17 0332  NA 137 137 135  K 3.3* 3.7 3.9  CL 104 97* 99*  CO2 26  --  28  GLUCOSE 133* 171* 129*  BUN 8 12 14   CREATININE 0.83 0.70 0.88  CALCIUM 7.8*  --  8.0*    PT/INR:  Lab Results  Component  Value Date   INR 1.38 10/27/2017   INR 1.40 10/25/2017   INR 1.27 10/25/2017   ABG:  INR: Will add last result for INR, ABG once components are confirmed Will add last 4 CBG results once components are confirmed  Assessment/Plan:  1. CV - S/p inferior MI, V fib arrest, cardiogenic shock. On Lopressor 12.5 mg bid and Milrinone drip. Co ox this am up to 65.7. Hope to continue wean of Milrinone. 2.  Pulmonary - Chest tube with 270 last 24 hours. Hope to remove chest tubes. On 4 liters of oxygen via [place]. Wean as able over next few days. CXR this am appears stable. Encourage incentive spirometer. 3. Volume Overload - On Lasix 20 mg IV QID. Will discuss with Dr. Donata ClayVan Trigt about changing to 40 mg bid. 4.  Acute blood loss anemia - H and H stable at 9 and 27.1 5. DM-CBGs 132/110/107. On Insulin but will decrease to avoid hypoglycemia. Will restart Metformin in am. Pre op HGA1C 6.2. 6. Supplement potassium  Donielle M ZimmermanPA-C 10/28/2017   patient examined and medical record reviewed,agree with above note. Kathlee Nationseter Van Trigt III 10/30/2017

## 2017-10-28 NOTE — Progress Notes (Signed)
Peripherally Inserted Central Catheter/Midline Placement  The IV Nurse has discussed with the patient and/or persons authorized to consent for the patient, the purpose of this procedure and the potential benefits and risks involved with this procedure.  The benefits include less needle sticks, lab draws from the catheter, and the patient may be discharged home with the catheter. Risks include, but not limited to, infection, bleeding, blood clot (thrombus formation), and puncture of an artery; nerve damage and irregular heartbeat and possibility to perform a PICC exchange if needed/ordered by physician.  Alternatives to this procedure were also discussed.  Bard Power PICC patient education guide, fact sheet on infection prevention and patient information card has been provided to patient /or left at bedside.    PICC/Midline Placement Documentation  PICC Double Lumen 10/28/17 PICC Right Brachial 42 cm 0 cm (Active)  Indication for Insertion or Continuance of Line Vasoactive infusions 10/28/2017  2:11 PM  Exposed Catheter (cm) 0 cm 10/28/2017  2:11 PM  Site Assessment Clean;Dry;Intact 10/28/2017  2:11 PM  Lumen #1 Status Flushed;Saline locked;Blood return noted 10/28/2017  2:11 PM  Lumen #2 Status Flushed;Saline locked;Blood return noted 10/28/2017  2:11 PM  Dressing Type Transparent 10/28/2017  2:11 PM  Dressing Status Clean;Dry;Antimicrobial disc in place;Intact 10/28/2017  2:11 PM  Dressing Change Due 11/04/17 10/28/2017  2:11 PM       Eddie Black, Eddie Black 10/28/2017, 2:12 PM

## 2017-10-28 NOTE — Progress Notes (Signed)
Initial Nutrition Assessment  DOCUMENTATION CODES:   Obesity unspecified  INTERVENTION:   Glucerna Shake po TID, each supplement provides 220 kcal and 10 grams of protein  NUTRITION DIAGNOSIS:   Increased nutrient needs related to acute illness(respiratory distress) as evidenced by estimated needs.  GOAL:   Patient will meet greater than or equal to 90% of their needs  MONITOR:   PO intake, Supplement acceptance, Weight trends, Labs  REASON FOR ASSESSMENT:   Malnutrition Screening Tool    ASSESSMENT:   Pt with PMH of newly dx DM, HTN, HLD and tobacco abuse admitted 3/29 with acute MI with OOH arrest. Pt found to have severe multivessel CAD now s/p CABG x 3.    Pt currently on 100% BiPAP, undergoing central line placement upon RD assessment. Spoke with nurse who reports pt ate 90% of breakfast this morning without complication. Pt walked with PT and experienced severe respiratory distress. Nurse suggested placement of cortrak tube today. Will wait to see if respiratory distress improves before tube placement as he requires continuous BiPAP. Pt unable to eat lunch today due to difficulty breathing. RD to add supplements for when pt can eat.   No family at bedside to obtain history. Records are limited in weight history. Unable to complete Nutrition-Focused physical exam at this time due to patient having sterile procedure upon assessment.   I/O: -2420 ml since admit UOP: 1395 ml this am   Medications reviewed and include: 40 ml Lasix, Levemir, reglan, IV abx Labs reviewed: Cl 99 (L)  Diet Order:  Diet Heart Room service appropriate? Yes with Assist; Fluid consistency: Thin; Fluid restriction: 1200 mL Fluid  EDUCATION NEEDS:   Not appropriate for education at this time  Skin:  Skin Assessment: Skin Integrity Issues: Skin Integrity Issues:: Incisions Incisions: closed chest, right leg  Last BM:  10/25/17  Height:   Ht Readings from Last 1 Encounters:  10/25/17 6\' 1"   (1.854 m)    Weight:   Wt Readings from Last 1 Encounters:  10/28/17 241 lb 2.9 oz (109.4 kg)    Ideal Body Weight:  83.6 kg  BMI:  Body mass index is 31.82 kg/m.  Estimated Nutritional Needs:   Kcal:  1950-2150 kcal  Protein:  105-115 g  Fluid:  >1.9 L/day    Vanessa Kickarly Jaydin Jalomo RD, LDN Clinical Nutrition Pager # - [phone]

## 2017-10-28 NOTE — Progress Notes (Signed)
Paged MD Minette HeadlandAljishi, MD with repeat blood gas results. Ph 7.43, pCO2 38.7, pO2 52, TCO2 27, HCO3 26.1.  No new orders at this time. Will continue to monitor acutely.  Delories HeinzMelissa Michole Lecuyer, RN

## 2017-10-28 NOTE — Progress Notes (Signed)
First echo attempt, patient having procedure done.(PICC line?)

## 2017-10-28 NOTE — Consult Note (Signed)
PULMONARY / CRITICAL CARE MEDICINE   Name: Eddie Black MRN: [id number] DOB: [birth year]    ADMISSION DATE:  10/25/2017 CONSULTATION DATE: 10/28/2017  REFERRING MD:  Donata Clay CHIEF COMPLAINT: shortness of breath   HISTORY OF PRESENT ILLNESS:   Mr Sautter is a 57 yr old who was admitted post vfib arrest on 10/25/2017 and was found to have multivessel disease and was taken for emergent CABG X3. Was extubated post operatively but started desaturating today from 4 litres oxygen to NRBFM and BIPAP 100% FIO2 on mobilization. I was called for stat consult to assess the patient. Patient is on BIPAP sitting on the chair in moderate distress but feeling somewhat better. Denies chest pain, fever. Wife at bedside.   He is known with DM, HTN Hyperlipidemia. He is also a smoker of more than 58yrs.    PAST MEDICAL HISTORY :  He  has a past medical history of Diabetes mellitus (HCC), Hyperlipemia, Hypertension, and Tobacco abuse.  PAST SURGICAL HISTORY: He  has a past surgical history that includes LEFT HEART CATH AND CORONARY ANGIOGRAPHY (N/A, 10/25/2017) and IABP Insertion (N/A, 10/25/2017).  No Known Allergies  No current facility-administered medications on file prior to encounter.    Current Outpatient Medications on File Prior to Encounter  Medication Sig  . atorvastatin (LIPITOR) 40 MG tablet Take 40 mg by mouth at bedtime.  . enalapril (VASOTEC) 20 MG tablet Take 20 mg by mouth at bedtime.  Marland Kitchen ibuprofen (ADVIL,MOTRIN) 800 MG tablet Take 800 mg by mouth 3 (three) times daily as needed (pain).   Marland Kitchen levothyroxine (SYNTHROID, LEVOTHROID) 25 MCG tablet Take 25 mcg by mouth daily.  . metFORMIN (GLUCOPHAGE) 500 MG tablet Take 500 mg by mouth 2 (two) times daily with a meal.  . Omega-3 Fatty Acids (FISH OIL TRIPLE STRENGTH PO) Take 1 capsule by mouth 2 (two) times daily.    FAMILY HISTORY:  His has no family status information on file.    SOCIAL HISTORY: He  reports that he has been smoking.  He  does not have any smokeless tobacco history on file. He reports that he drinks alcohol. He reports that he has current or past drug history. Drug: Marijuana.  REVIEW OF SYSTEMS:   All 11 point system review were unremarkable other than what is mentioned in HPI    VITAL SIGNS: BP 120/71   Pulse (!) 103   Temp 98.9 F (37.2 C) (Oral)   Resp (!) 24   Ht 6\' 1"  (1.854 m)   Wt 109.4 kg (241 lb 2.9 oz)   SpO2 95%   BMI 31.82 kg/m   HEMODYNAMICS: PAP: (31-46)/(19-27) 31/19  VENTILATOR SETTINGS: FiO2 (%):  [100 %] 100 %  INTAKE / OUTPUT: I/O last 3 completed shifts: In: 2629.5 [P.O.:660; I.V.:1269.5; IV Piggyback:700] Out: 4540 [Urine:4030; Chest Tube:510]  PHYSICAL EXAMINATION: General:  Looks in moderate distress Neuro:  Alert oriented moving all extremities  HEENT:  Moist  Cardiovascular:  Normal heart sounds no murmurs  Lungs:  Bilateral basal coarse crackles  Abdomen: soft no tenderness no megally  Musculoskeletal:  + edema  Skin:  No rash   LABS:  BMET Recent Labs  Lab 10/26/17 0431  10/27/17 0351 10/27/17 1657 10/28/17 0332  NA 139   < > 137 137 135  K 3.8   < > 3.3* 3.7 3.9  CL 108   < > 104 97* 99*  CO2 25  --  26  --  28  BUN 11   < >  8 12 14   CREATININE 0.84   < > 0.83 0.70 0.88  GLUCOSE 135*   < > 133* 171* 129*   < > = values in this interval not displayed.    Electrolytes Recent Labs  Lab 10/26/17 0431 10/26/17 1538 10/27/17 0351 10/28/17 0332  CALCIUM 7.8*  --  7.8* 8.0*  MG 2.4 1.9  --   --     CBC Recent Labs  Lab 10/26/17 1538  10/27/17 0351 10/27/17 1657 10/28/17 0332  WBC 12.1*  --  14.3*  --  13.5*  HGB 9.5*   < > 9.2* 8.5* 9.0*  HCT 29.1*   < > 28.3* 25.0* 27.1*  PLT 189  --  168  --  154   < > = values in this interval not displayed.    Coag's Recent Labs  Lab 10/25/17 1529 10/25/17 2254 10/27/17 0351  APTT >200* 27 53*  INR 1.27 1.40 1.38    Sepsis Markers No results for input(s): LATICACIDVEN, PROCALCITON,  O2SATVEN in the last 168 hours.  ABG Recent Labs  Lab 10/26/17 1421 10/26/17 1545 10/28/17 1232  PHART 7.408 7.445 7.428  PCO2ART 31.6* 38.7 39.1  PO2ART 69.0* 69.0* 45.0*    Liver Enzymes Recent Labs  Lab 10/25/17 1529 10/28/17 0332  AST 170* 25  ALT 259* 52  ALKPHOS 73 49  BILITOT 0.9 0.8  ALBUMIN 3.4* 2.8*    Cardiac Enzymes Recent Labs  Lab 10/25/17 1529  TROPONINI 0.10*    Glucose Recent Labs  Lab 10/27/17 1528 10/27/17 1913 10/27/17 2328 10/28/17 0326 10/28/17 0754 10/28/17 1116  GLUCAP 174* 132* 110* 107* 120* 152*    Imaging Dg Chest Port 1 View  Result Date: 10/28/2017 CLINICAL DATA:  Respiratory distress. Recent coronary artery bypass grafting EXAM: PORTABLE CHEST 1 VIEW COMPARISON:  October 28, 2017 study obtained earlier in the day FINDINGS: Cordis tip is near the junction of the right jugular vein and superior vena cava, stable. There is a chest tube on the left. There is a mediastinal drain. Temporary pacemaker wires are attached to the right heart. No pneumothorax. There is consolidation and atelectatic change in the left base. There is patchy infiltrate in the medial right base. Heart size is upper normal with pulmonary vascularity within normal limits. No adenopathy. There is aortic atherosclerosis. IMPRESSION: Tube and catheter positions as described without evident pneumothorax. Consolidation medial left base with atelectatic change also present in the left base. There is medial airspace consolidation in the right base, a new finding. Stable cardiac silhouette. There is aortic atherosclerosis. Aortic Atherosclerosis (ICD10-I70.0). Electronically Signed   By: Bretta BangWilliam  Woodruff III M.D.   On: 10/28/2017 12:15   Dg Chest Port 1 View  Result Date: 10/28/2017 CLINICAL DATA:  CABG EXAM: PORTABLE CHEST 1 VIEW COMPARISON:  10/27/2017 FINDINGS: Swan-Ganz catheter removed. Left chest tube in place. No pneumothorax Bibasilar airspace disease unchanged. Negative  for edema. Small pleural effusions bilaterally IMPRESSION: Bibasilar airspace disease and small effusions unchanged. No pneumothorax. Electronically Signed   By: Marlan Palauharles  Clark M.D.   On: 10/28/2017 07:55   [place] Ekg Site Rite  Result Date: 10/28/2017 If Site Rite image not attached, placement could not be confirmed due to current cardiac rhythm.    STUDIES:  Echo 10/25/2017 EF50-55%     ANTIBIOTICS: Cefazolin Start zosyn 4/1   DISCUSSION: 57 yr old male with post vfib arrest s/p CABG x3 on 3/29, DM, HTN tobacco abuser now with acute hypoxemic respiratory failure secondary to COPD  exacerbation and acute pulmonary edema and acute diastolic heart failure. Patient does have some infiltrates suggestive of aspiration pneumonia   ASSESSMENT / PLAN:  PULMONARY A: acute hypoxemic resp failure COPD exacerbation Aspiration pneumonia Acute pulm edema   P:   - IV solumedrol  - nebs - iv lasix stat  - repeat BAG - BIPAP  - I will hold off on intubation for now since looking more stable   CARDIOVASCULAR A:  V fib arrest CAD  S/p CABGX3 3/29   P:  - milrinone drip - IV lasix stat  - as per thoracic Sx  RENAL A:    P:   Replace electrolytes as needed   GASTROINTESTINAL A:    P:   NPO for possible intubation   HEMATOLOGIC A:   Leukocytosis possible related to pneumonia  P:  Trend CBC   INFECTIOUS A:   Asp PNA   P:   Start zosyn  Sputum culture stat   ENDOCRINE A:   DM    P:   Sliding scale   NEUROLOGIC A:   Intact   I have spent 40 mins of CC time bedside or in the unit exclusive of any billable procedures.   FAMILY  - Updates: wife at bedside updated   - Inter-disciplinary family meet or Palliative Care meeting due by: 4/8   Pulmonary and Critical Care Medicine [facility] Pager: [phone]  10/28/2017, 1:04 PM

## 2017-10-28 NOTE — Progress Notes (Signed)
Notified CCM (Aljishi, MD) of recent blood gas pO2 = 58 .... No new orders at this time as long as pO2 is 50 ++... Sts "pt may" have to sleep on bi-pap tonight and will continue to monitor.

## 2017-10-29 ENCOUNTER — Inpatient Hospital Stay (HOSPITAL_COMMUNITY): Payer: BLUE CROSS/BLUE SHIELD

## 2017-10-29 DIAGNOSIS — M7989 Other specified soft tissue disorders: Secondary | ICD-10-CM

## 2017-10-29 DIAGNOSIS — I361 Nonrheumatic tricuspid (valve) insufficiency: Secondary | ICD-10-CM

## 2017-10-29 DIAGNOSIS — R609 Edema, unspecified: Secondary | ICD-10-CM

## 2017-10-29 LAB — COMPREHENSIVE METABOLIC PANEL
ALT: 39 U/L (ref 17–63)
AST: 21 U/L (ref 15–41)
Albumin: 2.8 g/dL — ABNORMAL LOW (ref 3.5–5.0)
Alkaline Phosphatase: 53 U/L (ref 38–126)
Anion gap: 12 (ref 5–15)
BUN: 22 mg/dL — ABNORMAL HIGH (ref 6–20)
CO2: 28 mmol/L (ref 22–32)
Calcium: 8.5 mg/dL — ABNORMAL LOW (ref 8.9–10.3)
Chloride: 97 mmol/L — ABNORMAL LOW (ref 101–111)
Creatinine, Ser: 0.88 mg/dL (ref 0.61–1.24)
GFR calc Af Amer: >60 mL/min (ref >60)
GFR calc non Af Amer: >60 mL/min (ref >60)
Glucose, Bld: 149 mg/dL — ABNORMAL HIGH (ref 65–99)
Potassium: 3.8 mmol/L (ref 3.5–5.1)
Sodium: 137 mmol/L (ref 135–145)
Total Bilirubin: 0.6 mg/dL (ref 0.3–1.2)
Total Protein: 5.7 g/dL — ABNORMAL LOW (ref 6.5–8.1)

## 2017-10-29 LAB — TYPE AND SCREEN
ABO/RH(D): O POS
Antibody Screen: NEGATIVE
Unit division: 0
Unit division: 0
Unit division: 0
Unit division: 0

## 2017-10-29 LAB — GLUCOSE, CAPILLARY
Glucose-Capillary: 123 mg/dL — ABNORMAL HIGH (ref 65–99)
Glucose-Capillary: 142 mg/dL — ABNORMAL HIGH (ref 65–99)
Glucose-Capillary: 150 mg/dL — ABNORMAL HIGH (ref 65–99)
Glucose-Capillary: 160 mg/dL — ABNORMAL HIGH (ref 65–99)
Glucose-Capillary: 172 mg/dL — ABNORMAL HIGH (ref 65–99)
Glucose-Capillary: 176 mg/dL — ABNORMAL HIGH (ref 65–99)

## 2017-10-29 LAB — CBC WITH DIFFERENTIAL/PLATELET
BASOS ABS: 0 10*3/uL (ref 0.0–0.1)
BASOS PCT: 0 %
Eosinophils Absolute: 0 10*3/uL (ref 0.0–0.7)
Eosinophils Relative: 0 %
HCT: 26 % — ABNORMAL LOW (ref 39.0–52.0)
Hemoglobin: 8.8 g/dL — ABNORMAL LOW (ref 13.0–17.0)
LYMPHS PCT: 7 %
Lymphs Abs: 1.2 10*3/uL (ref 0.7–4.0)
MCH: 30.8 pg (ref 26.0–34.0)
MCHC: 33.8 g/dL (ref 30.0–36.0)
MCV: 90.9 fL (ref 78.0–100.0)
MONO ABS: 2.1 10*3/uL — AB (ref 0.1–1.0)
Monocytes Relative: 12 %
Neutro Abs: 15 10*3/uL — ABNORMAL HIGH (ref 1.7–7.7)
Neutrophils Relative %: 81 %
PLATELETS: 256 10*3/uL (ref 150–400)
RBC: 2.86 MIL/uL — AB (ref 4.22–5.81)
RDW: 13.5 % (ref 11.5–15.5)
WBC: 18.3 10*3/uL — AB (ref 4.0–10.5)

## 2017-10-29 LAB — BASIC METABOLIC PANEL
ANION GAP: 12 (ref 5–15)
BUN: 25 mg/dL — ABNORMAL HIGH (ref 6–20)
CALCIUM: 8.3 mg/dL — AB (ref 8.9–10.3)
CO2: 27 mmol/L (ref 22–32)
Chloride: 97 mmol/L — ABNORMAL LOW (ref 101–111)
Creatinine, Ser: 0.88 mg/dL (ref 0.61–1.24)
GLUCOSE: 159 mg/dL — AB (ref 65–99)
Potassium: 3.7 mmol/L (ref 3.5–5.1)
Sodium: 136 mmol/L (ref 135–145)

## 2017-10-29 LAB — BPAM RBC
Blood Product Expiration Date: 201904232359
Blood Product Expiration Date: 201904232359
Blood Product Expiration Date: 201904232359
Blood Product Expiration Date: 201904242359
ISSUE DATE / TIME: 201903291844
ISSUE DATE / TIME: 201904010903
Unit Type and Rh: 5100
Unit Type and Rh: 5100
Unit Type and Rh: 5100
Unit Type and Rh: 5100

## 2017-10-29 LAB — ECHOCARDIOGRAM COMPLETE
HEIGHTINCHES: 73 in
Weight: 3820.13 oz

## 2017-10-29 LAB — CBC
HCT: 26.9 % — ABNORMAL LOW (ref 39.0–52.0)
Hemoglobin: 8.7 g/dL — ABNORMAL LOW (ref 13.0–17.0)
MCH: 29.4 pg (ref 26.0–34.0)
MCHC: 32.3 g/dL (ref 30.0–36.0)
MCV: 90.9 fL (ref 78.0–100.0)
Platelets: 217 10*3/uL (ref 150–400)
RBC: 2.96 MIL/uL — ABNORMAL LOW (ref 4.22–5.81)
RDW: 13.3 % (ref 11.5–15.5)
WBC: 15.1 10*3/uL — ABNORMAL HIGH (ref 4.0–10.5)

## 2017-10-29 LAB — COOXEMETRY PANEL
Carboxyhemoglobin: 0.7 % (ref 0.5–1.5)
Methemoglobin: 1.7 % — ABNORMAL HIGH (ref 0.0–1.5)
O2 Saturation: 58 %
Total hemoglobin: 9 g/dL — ABNORMAL LOW (ref 12.0–16.0)

## 2017-10-29 MED ORDER — LACTULOSE 10 GM/15ML PO SOLN
20.0000 g | Freq: Once | ORAL | Status: AC
  Administered 2017-10-29: 20 g via ORAL
  Filled 2017-10-29: qty 30

## 2017-10-29 MED ORDER — POTASSIUM CHLORIDE 10 MEQ/50ML IV SOLN
10.0000 meq | INTRAVENOUS | Status: AC
  Administered 2017-10-29 (×3): 10 meq via INTRAVENOUS
  Filled 2017-10-29: qty 50

## 2017-10-29 MED ORDER — PERFLUTREN LIPID MICROSPHERE
1.0000 mL | INTRAVENOUS | Status: AC | PRN
  Administered 2017-10-29: 6 mL via INTRAVENOUS
  Filled 2017-10-29 (×2): qty 10

## 2017-10-29 MED FILL — Heparin Sodium (Porcine) Inj 1000 Unit/ML: INTRAMUSCULAR | Qty: 10 | Status: AC

## 2017-10-29 MED FILL — Sodium Bicarbonate IV Soln 8.4%: INTRAVENOUS | Qty: 50 | Status: AC

## 2017-10-29 MED FILL — Mannitol IV Soln 20%: INTRAVENOUS | Qty: 500 | Status: AC

## 2017-10-29 MED FILL — Sodium Chloride IV Soln 0.9%: INTRAVENOUS | Qty: 2000 | Status: AC

## 2017-10-29 MED FILL — Lidocaine HCl IV Inj 20 MG/ML: INTRAVENOUS | Qty: 10 | Status: AC

## 2017-10-29 MED FILL — Electrolyte-R (PH 7.4) Solution: INTRAVENOUS | Qty: 2000 | Status: AC

## 2017-10-29 NOTE — Progress Notes (Signed)
DAILY PROGRESS NOTE   Patient Name: Eddie Black Date of Encounter: 10/29/2017  Chief Complaint   Shortness of breath has improved  Patient Profile   Eddie Black is a 57 y.o. male with a history of newly diagnosed DM, HTN, HLD and h/o tobacco abuse, presenting to the [facility] ED via EMS for out of hospital Vfib arrest. ROSC achieved with CPR + defibrillation. Post defibrillation EKG showed atrial fibrillation with widespread ST elevations.    Subjective   Noted on bipap overnight, but now on [place] - PCCM seen yesterday. Repeat echo ordered - I personally reviewed the study, LVEF is normal at 65-70%, no obvious regional wall motion abnormalities - filling pressure is not significantly elevated, there is a trivial pericardial effusion posteriorly, likely post-surgical. He is being diuresed - 2.5L negative yesterday, close to 4.2L negative overall. Creatinine is stable. Weight is down 3 lbs.   Objective   Vitals:   10/29/17 0700 10/29/17 0800 10/29/17 0801 10/29/17 0830  BP: 124/70 127/77    Pulse: 80 85    Resp: 17 20    Temp:   98.5 F (36.9 C)   TempSrc:   Axillary   SpO2: 94% 95%  95%  Weight:      Height:        Intake/Output Summary (Last 24 hours) at 10/29/2017 1041 Last data filed at 10/29/2017 1000 Gross per 24 hour  Intake 298.4 ml  Output 3315 ml  Net -3016.6 ml   Filed Weights   10/28/17 0600 10/28/17 0800 10/29/17 0500  Weight: 241 lb 2.9 oz (109.4 kg) 241 lb 2.9 oz (109.4 kg) 238 lb 12.1 oz (108.3 kg)    Physical Exam   General appearance: alert, no distress and morbidly obese Neck: no carotid bruit, no JVD and thyroid not enlarged, symmetric, no tenderness/mass/nodules Lungs: diminished breath sounds bilaterally Heart: regular rate and rhythm Abdomen: soft, non-tender; bowel sounds normal; no masses,  no organomegaly and obese Extremities: edema 1+ Pulses: 2+ and symmetric Skin: Skin color, texture, turgor normal. No rashes or lesions Neurologic: Grossly  normal Psych: Pleasant  Inpatient Medications    Scheduled Meds: . acetaminophen  1,000 mg Oral Q6H   Or  . acetaminophen (TYLENOL) oral liquid 160 mg/5 mL  1,000 mg Per Tube Q6H  . aspirin EC  325 mg Oral Daily   Or  . aspirin  324 mg Per Tube Daily  . bisacodyl  10 mg Oral Daily   Or  . bisacodyl  10 mg Rectal Daily  . Chlorhexidine Gluconate Cloth  6 each Topical Daily  . Chlorhexidine Gluconate Cloth  6 each Topical Daily  . docusate sodium  200 mg Oral Daily  . enoxaparin (LOVENOX) injection  40 mg Subcutaneous Q24H  . feeding supplement (GLUCERNA SHAKE)  237 mL Oral TID BM  . furosemide  40 mg Intravenous BID  . insulin detemir  12 Units Subcutaneous BID  . lactulose  20 g Oral Once  . mouth rinse  15 mL Mouth Rinse BID  . metoCLOPramide (REGLAN) injection  10 mg Intravenous Q6H  . metoprolol tartrate  12.5 mg Oral BID   Or  . metoprolol tartrate  12.5 mg Per Tube BID  . pantoprazole  40 mg Oral Daily  . sodium chloride flush  10-40 mL Intracatheter Q12H  . sodium chloride flush  3 mL Intravenous Q12H    Continuous Infusions: . sodium chloride    . sodium chloride 10 mL/hr at 10/27/17 1630  . sodium  chloride    . lactated ringers 10 mL/hr at 10/27/17 1100  . milrinone 0.125 mcg/kg/min (10/29/17 1000)  . piperacillin-tazobactam (ZOSYN)  IV Stopped (10/29/17 0548)  . potassium chloride 10 mEq (10/29/17 0952)    PRN Meds: Place/Maintain arterial line **AND** sodium chloride, dextromethorphan-guaiFENesin, levalbuterol, metoprolol tartrate, ondansetron (ZOFRAN) IV, oxyCODONE, perflutren lipid microspheres (DEFINITY) IV suspension, pneumococcal 23 valent vaccine, sodium chloride flush, sodium chloride flush, sodium chloride flush, traMADol   Labs   Results for orders placed or performed during the hospital encounter of 10/25/17 (from the past 48 hour(s))  Glucose, capillary     Status: Abnormal   Collection Time: 10/27/17 11:31 AM  Result Value Ref Range    Glucose-Capillary 111 (H) 65 - 99 mg/dL   Comment 1 Capillary Specimen    Comment 2 Notify RN   Glucose, capillary     Status: Abnormal   Collection Time: 10/27/17  3:28 PM  Result Value Ref Range   Glucose-Capillary 174 (H) 65 - 99 mg/dL   Comment 1 Capillary Specimen    Comment 2 Notify RN   Cooxemetry Panel (carboxy, met, total hgb, O2 sat)     Status: Abnormal   Collection Time: 10/27/17  4:55 PM  Result Value Ref Range   Total hemoglobin 8.9 (L) 12.0 - 16.0 g/dL   O2 Saturation 53.0 %   Carboxyhemoglobin 1.2 0.5 - 1.5 %   Methemoglobin 0.9 0.0 - 1.5 %  I-STAT, chem 8     Status: Abnormal   Collection Time: 10/27/17  4:57 PM  Result Value Ref Range   Sodium 137 135 - 145 mmol/L   Potassium 3.7 3.5 - 5.1 mmol/L   Chloride 97 (L) 101 - 111 mmol/L   BUN 12 6 - 20 mg/dL   Creatinine, Ser 0.70 0.61 - 1.24 mg/dL   Glucose, Bld 171 (H) 65 - 99 mg/dL   Calcium, Ion 1.19 1.15 - 1.40 mmol/L   TCO2 25 22 - 32 mmol/L   Hemoglobin 8.5 (L) 13.0 - 17.0 g/dL   HCT 25.0 (L) 39.0 - 52.0 %  Glucose, capillary     Status: Abnormal   Collection Time: 10/27/17  7:13 PM  Result Value Ref Range   Glucose-Capillary 132 (H) 65 - 99 mg/dL   Comment 1 Notify RN   Glucose, capillary     Status: Abnormal   Collection Time: 10/27/17 11:28 PM  Result Value Ref Range   Glucose-Capillary 110 (H) 65 - 99 mg/dL   Comment 1 Notify RN   Glucose, capillary     Status: Abnormal   Collection Time: 10/28/17  3:26 AM  Result Value Ref Range   Glucose-Capillary 107 (H) 65 - 99 mg/dL   Comment 1 Notify RN   CBC     Status: Abnormal   Collection Time: 10/28/17  3:32 AM  Result Value Ref Range   WBC 13.5 (H) 4.0 - 10.5 K/uL   RBC 2.92 (L) 4.22 - 5.81 MIL/uL   Hemoglobin 9.0 (L) 13.0 - 17.0 g/dL   HCT 27.1 (L) 39.0 - 52.0 %   MCV 92.8 78.0 - 100.0 fL   MCH 30.8 26.0 - 34.0 pg   MCHC 33.2 30.0 - 36.0 g/dL   RDW 13.7 11.5 - 15.5 %   Platelets 154 150 - 400 K/uL    Comment: Performed at [facility], [address], [place], [place] [address]  Comprehensive metabolic panel     Status: Abnormal   Collection Time: 10/28/17  3:32 AM  Result Value Ref Range   Sodium 135 135 - 145 mmol/L   Potassium 3.9 3.5 - 5.1 mmol/L   Chloride 99 (L) 101 - 111 mmol/L   CO2 28 22 - 32 mmol/L   Glucose, Bld 129 (H) 65 - 99 mg/dL   BUN 14 6 - 20 mg/dL   Creatinine, Ser 0.88 0.61 - 1.24 mg/dL   Calcium 8.0 (L) 8.9 - 10.3 mg/dL   Total Protein 5.0 (L) 6.5 - 8.1 g/dL   Albumin 2.8 (L) 3.5 - 5.0 g/dL   AST 25 15 - 41 U/L   ALT 52 17 - 63 U/L   Alkaline Phosphatase 49 38 - 126 U/L   Total Bilirubin 0.8 0.3 - 1.2 mg/dL   GFR calc non Af Amer >60 >60 mL/min   GFR calc Af Amer >60 >60 mL/min    Comment: (NOTE) The eGFR has been calculated using the CKD EPI equation. This calculation has not been validated in all clinical situations. eGFR's persistently <60 mL/min signify possible Chronic Kidney Disease.    Anion gap 8 5 - 15    Comment: Performed at [facility] [address] [place], [place] [address]  .Cooxemetry Panel (carboxy, met, total hgb, O2 sat)     Status: Abnormal   Collection Time: 10/28/17  3:55 AM  Result Value Ref Range   Total hemoglobin 9.1 (L) 12.0 - 16.0 g/dL   O2 Saturation 65.7 %   Carboxyhemoglobin 1.0 0.5 - 1.5 %   Methemoglobin 1.6 (H) 0.0 - 1.5 %  Glucose, capillary     Status: Abnormal   Collection Time: 10/28/17  7:54 AM  Result Value Ref Range   Glucose-Capillary 120 (H) 65 - 99 mg/dL   Comment 1 Notify RN   Glucose, capillary     Status: Abnormal   Collection Time: 10/28/17 11:16 AM  Result Value Ref Range   Glucose-Capillary 152 (H) 65 - 99 mg/dL   Comment 1 Capillary Specimen   I-STAT 3, arterial blood gas (G3+)     Status: Abnormal   Collection Time: 10/28/17 12:32 PM  Result Value Ref Range   pH, Arterial 7.428 7.350 - 7.450   pCO2 arterial 39.1 32.0 - 48.0 mmHg   pO2, Arterial 45.0 (L) 83.0 - 108.0 mmHg   Bicarbonate 25.8 20.0 - 28.0 mmol/L   TCO2  27 22 - 32 mmol/L   O2 Saturation 81.0 %   Acid-Base Excess 1.0 0.0 - 2.0 mmol/L   Patient temperature 98.9 F    Collection site RADIAL, ALLEN'S TEST ACCEPTABLE    Drawn by RT    Sample type ARTERIAL   I-STAT 3, arterial blood gas (G3+)     Status: Abnormal   Collection Time: 10/28/17  1:28 PM  Result Value Ref Range   pH, Arterial 7.438 7.350 - 7.450   pCO2 arterial 38.7 32.0 - 48.0 mmHg   pO2, Arterial 52.0 (L) 83.0 - 108.0 mmHg   Bicarbonate 26.1 20.0 - 28.0 mmol/L   TCO2 27 22 - 32 mmol/L   O2 Saturation 87.0 %   Acid-Base Excess 2.0 0.0 - 2.0 mmol/L   Patient temperature 98.9 F    Collection site RADIAL, ALLEN'S TEST ACCEPTABLE    Drawn by RT    Sample type ARTERIAL   I-STAT 3, arterial blood gas (G3+)     Status: Abnormal   Collection Time: 10/28/17  5:30 PM  Result Value Ref Range   pH, Arterial 7.459 (H)  7.350 - 7.450   pCO2 arterial 39.3 32.0 - 48.0 mmHg   pO2, Arterial 58.0 (L) 83.0 - 108.0 mmHg   Bicarbonate 27.8 20.0 - 28.0 mmol/L   TCO2 29 22 - 32 mmol/L   O2 Saturation 91.0 %   Acid-Base Excess 4.0 (H) 0.0 - 2.0 mmol/L   Patient temperature 98.9 F    Collection site RADIAL, ALLEN'S TEST ACCEPTABLE    Drawn by RT    Sample type ARTERIAL   I-STAT, chem 8     Status: Abnormal   Collection Time: 10/28/17  5:32 PM  Result Value Ref Range   Sodium 134 (L) 135 - 145 mmol/L   Potassium 4.1 3.5 - 5.1 mmol/L   Chloride 96 (L) 101 - 111 mmol/L   BUN 19 6 - 20 mg/dL   Creatinine, Ser 0.70 0.61 - 1.24 mg/dL   Glucose, Bld 163 (H) 65 - 99 mg/dL   Calcium, Ion 1.12 (L) 1.15 - 1.40 mmol/L   TCO2 27 22 - 32 mmol/L   Hemoglobin 9.5 (L) 13.0 - 17.0 g/dL   HCT 28.0 (L) 39.0 - 52.0 %  Glucose, capillary     Status: Abnormal   Collection Time: 10/28/17  7:46 PM  Result Value Ref Range   Glucose-Capillary 170 (H) 65 - 99 mg/dL  Glucose, capillary     Status: Abnormal   Collection Time: 10/28/17 11:42 PM  Result Value Ref Range   Glucose-Capillary 172 (H) 65 - 99 mg/dL    Culture, expectorated sputum-assessment     Status: None (Preliminary result)   Collection Time: 10/29/17 12:45 AM  Result Value Ref Range   Specimen Description EXPECTORATED SPUTUM    Special Requests NONE    Sputum evaluation      THIS SPECIMEN IS ACCEPTABLE FOR SPUTUM CULTURE Performed at [facility], [address] [place], [place] [address]    Report Status PENDING   Culture, respiratory (NON-Expectorated)     Status: None (Preliminary result)   Collection Time: 10/29/17 12:45 AM  Result Value Ref Range   Specimen Description EXPECTORATED SPUTUM    Special Requests NONE Reflexed from [id number]    Gram Stain      ABUNDANT WBC PRESENT, PREDOMINANTLY PMN RARE GRAM POSITIVE COCCI Performed at [facility], [place] [address] [place], [place] [address]    Culture PENDING    Report Status PENDING   Glucose, capillary     Status: Abnormal   Collection Time: 10/29/17  3:28 AM  Result Value Ref Range   Glucose-Capillary 142 (H) 65 - 99 mg/dL   Comment 1 Capillary Specimen   .Cooxemetry Panel (carboxy, met, total hgb, O2 sat)     Status: Abnormal   Collection Time: 10/29/17  4:15 AM  Result Value Ref Range   Total hemoglobin 9.0 (L) 12.0 - 16.0 g/dL   O2 Saturation 58.0 %   Carboxyhemoglobin 0.7 0.5 - 1.5 %   Methemoglobin 1.7 (H) 0.0 - 1.5 %  CBC     Status: Abnormal   Collection Time: 10/29/17  4:18 AM  Result Value Ref Range   WBC 15.1 (H) 4.0 - 10.5 K/uL   RBC 2.96 (L) 4.22 - 5.81 MIL/uL   Hemoglobin 8.7 (L) 13.0 - 17.0 g/dL   HCT 26.9 (L) 39.0 - 52.0 %   MCV 90.9 78.0 - 100.0 fL   MCH 29.4 26.0 - 34.0 pg   MCHC 32.3 30.0 - 36.0 g/dL   RDW 13.3 11.5 - 15.5 %  Platelets 217 150 - 400 K/uL    Comment: Performed at [facility], [place] [address], [place], [place] [address]  Comprehensive metabolic panel     Status: Abnormal   Collection Time: 10/29/17  4:18 AM  Result Value Ref Range   Sodium 137 135 - 145 mmol/L   Potassium 3.8 3.5 - 5.1 mmol/L    Chloride 97 (L) 101 - 111 mmol/L   CO2 28 22 - 32 mmol/L   Glucose, Bld 149 (H) 65 - 99 mg/dL   BUN 22 (H) 6 - 20 mg/dL   Creatinine, Ser 0.88 0.61 - 1.24 mg/dL   Calcium 8.5 (L) 8.9 - 10.3 mg/dL   Total Protein 5.7 (L) 6.5 - 8.1 g/dL   Albumin 2.8 (L) 3.5 - 5.0 g/dL   AST 21 15 - 41 U/L   ALT 39 17 - 63 U/L   Alkaline Phosphatase 53 38 - 126 U/L   Total Bilirubin 0.6 0.3 - 1.2 mg/dL   GFR calc non Af Amer >60 >60 mL/min   GFR calc Af Amer >60 >60 mL/min    Comment: (NOTE) The eGFR has been calculated using the CKD EPI equation. This calculation has not been validated in all clinical situations. eGFR's persistently <60 mL/min signify possible Chronic Kidney Disease.    Anion gap 12 5 - 15    Comment: Performed at [facility] [address] [place], [place] [address]  Glucose, capillary     Status: Abnormal   Collection Time: 10/29/17  8:00 AM  Result Value Ref Range   Glucose-Capillary 123 (H) 65 - 99 mg/dL   Comment 1 Notify RN   CBC with Differential/Platelet     Status: Abnormal   Collection Time: 10/29/17  8:51 AM  Result Value Ref Range   WBC 18.3 (H) 4.0 - 10.5 K/uL   RBC 2.86 (L) 4.22 - 5.81 MIL/uL   Hemoglobin 8.8 (L) 13.0 - 17.0 g/dL   HCT 26.0 (L) 39.0 - 52.0 %   MCV 90.9 78.0 - 100.0 fL   MCH 30.8 26.0 - 34.0 pg   MCHC 33.8 30.0 - 36.0 g/dL   RDW 13.5 11.5 - 15.5 %   Platelets 256 150 - 400 K/uL   Neutrophils Relative % 81 %   Neutro Abs 15.0 (H) 1.7 - 7.7 K/uL   Lymphocytes Relative 7 %   Lymphs Abs 1.2 0.7 - 4.0 K/uL   Monocytes Relative 12 %   Monocytes Absolute 2.1 (H) 0.1 - 1.0 K/uL   Eosinophils Relative 0 %   Eosinophils Absolute 0.0 0.0 - 0.7 K/uL   Basophils Relative 0 %   Basophils Absolute 0.0 0.0 - 0.1 K/uL    Comment: Performed at [facility], [address], [place], [place] [address]  Basic metabolic panel     Status: Abnormal   Collection Time: 10/29/17  8:51 AM  Result Value Ref Range   Sodium 136 135 - 145 mmol/L    Potassium 3.7 3.5 - 5.1 mmol/L   Chloride 97 (L) 101 - 111 mmol/L   CO2 27 22 - 32 mmol/L   Glucose, Bld 159 (H) 65 - 99 mg/dL   BUN 25 (H) 6 - 20 mg/dL   Creatinine, Ser 0.88 0.61 - 1.24 mg/dL   Calcium 8.3 (L) 8.9 - 10.3 mg/dL   GFR calc non Af Amer >60 >60 mL/min   GFR calc Af Amer >60 >60 mL/min    Comment: (NOTE) The  eGFR has been calculated using the CKD EPI equation. This calculation has not been validated in all clinical situations. eGFR's persistently <60 mL/min signify possible Chronic Kidney Disease.    Anion gap 12 5 - 15    Comment: Performed at [facility] [address], [place], [place] [address]    ECG   N/A  Telemetry   Sinus rhythm - Personally Reviewed  Radiology    Dg Chest Port 1 View  Result Date: 10/29/2017 CLINICAL DATA:  Status post CABG. Current smoker. History of diabetes and hypertension. EXAM: PORTABLE CHEST 1 VIEW COMPARISON:  Portable chest x-ray of October 28, 2017 FINDINGS: The lungs are adequately inflated. The lung markings remain coarse at the bases but have improved slightly. The interstitial markings elsewhere slightly more conspicuous today. The heart is normal in size. The pulmonary vascularity is not engorged. There is calcification in the wall of the aortic arch. The left chest tube is in stable position as is the mediastinal drain. The right PICC line tip projects over the midportion of the SVC. IMPRESSION: Bibasilar atelectasis slightly less conspicuous today. Slight increase in the interstitial markings suggests low-grade interstitial edema. No significant cardiomegaly. The remaining support tubes are in reasonable position. Electronically Signed   By: David  Martinique M.D.   On: 10/29/2017 07:59   Dg Chest Port 1 View  Result Date: 10/28/2017 CLINICAL DATA:  Respiratory distress. Recent coronary artery bypass grafting EXAM: PORTABLE CHEST 1 VIEW COMPARISON:  October 28, 2017 study obtained earlier in the day FINDINGS: Cordis tip is near  the junction of the right jugular vein and superior vena cava, stable. There is a chest tube on the left. There is a mediastinal drain. Temporary pacemaker wires are attached to the right heart. No pneumothorax. There is consolidation and atelectatic change in the left base. There is patchy infiltrate in the medial right base. Heart size is upper normal with pulmonary vascularity within normal limits. No adenopathy. There is aortic atherosclerosis. IMPRESSION: Tube and catheter positions as described without evident pneumothorax. Consolidation medial left base with atelectatic change also present in the left base. There is medial airspace consolidation in the right base, a new finding. Stable cardiac silhouette. There is aortic atherosclerosis. Aortic Atherosclerosis (ICD10-I70.0). Electronically Signed   By: Lowella Grip III M.D.   On: 10/28/2017 12:15   Dg Chest Port 1 View  Result Date: 10/28/2017 CLINICAL DATA:  CABG EXAM: PORTABLE CHEST 1 VIEW COMPARISON:  10/27/2017 FINDINGS: Swan-Ganz catheter removed. Left chest tube in place. No pneumothorax Bibasilar airspace disease unchanged. Negative for edema. Small pleural effusions bilaterally IMPRESSION: Bibasilar airspace disease and small effusions unchanged. No pneumothorax. Electronically Signed   By: Franchot Gallo M.D.   On: 10/28/2017 07:55   [place] Ekg Site Rite  Result Date: 10/28/2017 If Site Rite image not attached, placement could not be confirmed due to current cardiac rhythm.   Cardiac Studies   Formal echo read pending  Assessment   1. Principal Problem: 2.   Cardiac arrest ([place]) 3. Active Problems: 4.   Diabetes mellitus type II, controlled ([place]) 5.   Hyperlipidemia LDL goal <70 6.   Essential hypertension 7.   Tobacco abuse 8.   CAD (coronary artery disease), native coronary artery 9.   Ventricular fibrillation ([place]) 10.   MI, acute, non ST segment elevation ([place]) 11.   S/P CABG x 3 12.   Acute hypoxemic respiratory  failure ([facility]) 13.   COPD exacerbation ([place]) 14.   Aspiration  pneumonia due to gastric secretions ([place]) 15.   Plan   1. Possible diastolic CHF and/or COPD exacerbation - my read of echo shows hyperdynamic LV function of 65-70%, trivial posterior pericardial effusion, grade 1 DD with indeterminate LV filling pressure - good response to lasix - expect he is nearing euvolemia - monitor creatinine. Will follow-up tomorrow.   Time Spent Directly with Patient:  I have spent a total of 25 minutes with the patient reviewing hospital notes, telemetry, EKGs, labs and examining the patient as well as establishing an assessment and plan that was discussed personally with the patient. > 50% of time was spent in direct patient care.  Length of Stay:  LOS: 4 days   Pixie Casino, MD, [facility], [place] Director of the Advanced Lipid Disorders &  Cardiovascular Risk Reduction Clinic Diplomate of the American Board of Clinical Lipidology Attending Cardiologist  Direct Dial: [phone]  Fax: [phone]  Website:  [email or web address] Osgood Jeriko Kowalke 10/29/2017, 10:41 AM

## 2017-10-29 NOTE — Progress Notes (Signed)
  Echocardiogram 2D Echocardiogram has been performed.  Eddie Black G Eddie Black 10/29/2017, 10:03 AM

## 2017-10-29 NOTE — Care Management Note (Signed)
Case Management Note Donn PieriniKristi Charmel Pronovost RN, BSN Unit 4E-Case Manager-- 2H coverage [phone]  Patient Details  Name: Eddie AlexandersRandall Cranfield MRN: [id number] Date of Birth: 02-20-1961  Subjective/Objective:  Pt admitted  S/p out of hospital Vfib arrest                Action/Plan: PTA pt lived at home with spouse- anticipate return home- CM to follow for transition needs.   Expected Discharge Date:                  Expected Discharge Plan:     In-House Referral:     Discharge planning Services  CM Consult  Post Acute Care Choice:    Choice offered to:     DME Arranged:    DME Agency:     HH Arranged:    HH Agency:     Status of Service:  In process, will continue to follow  If discussed at Long Length of Stay Meetings, dates discussed:    Discharge Disposition:   Additional Comments:  [name], Concettina Leth Hall, RN 10/29/2017, 4:38 PM

## 2017-10-29 NOTE — Progress Notes (Signed)
Placed patient back on BIPAP due to decline in Spo2 post 6 lpm [place], 55% venturi mask. Sats dropped in mid 70's during transition. Currently on 12/6, Fio2 60%. RN aware.

## 2017-10-29 NOTE — Progress Notes (Signed)
Patient placed on high flow nasal cannula at 14lpm

## 2017-10-29 NOTE — Progress Notes (Addendum)
[address]       [place] [address]             [phone]        4 Days Post-Op Procedure(s) (LRB): CORONARY ARTERY BYPASS GRAFTING (CABG) times three, WITH ENDOSCOPIC HARVESTING OF RIGHT SAPHENOUS VEIN (N/A) TRANSESOPHAGEAL ECHOCARDIOGRAM (TEE) (N/A)  Subjective: Patient on bipap this am Sudden postop respiratory distress - appreciate CCM input He is improving with cxr clearingObjective: Vital signs in last 24 hours: Temp:  [98.1 F (36.7 C)-99.3 F (37.4 C)] 98.1 F (36.7 C) (04/02 0410) Pulse Rate:  [52-117] 80 (04/02 0700) Cardiac Rhythm: Normal sinus rhythm (04/02 0400) Resp:  [13-36] 17 (04/02 0700) BP: (115-151)/(60-79) 124/70 (04/02 0700) SpO2:  [62 %-100 %] 94 % (04/02 0700) FiO2 (%):  [60 %-100 %] 60 % (04/02 0600) Weight:  [238 lb 12.1 oz (108.3 kg)-241 lb 2.9 oz (109.4 kg)] 238 lb 12.1 oz (108.3 kg) (04/02 0500)  Pre op weight 110.4 kg Current Weight  10/29/17 238 lb 12.1 oz (108.3 kg)       Intake/Output from previous day: 04/01 0701 - 04/02 0700 In: 202.5 [I.V.:102.5; IV Piggyback:100] Out: 2710 [Urine:2410; Chest Tube:300]   Physical Exam:  Cardiovascular: RRR Pulmonary: On bipap, slightly diminished at bases Abdomen: Soft, non tender, sporadic bowel sounds present. Extremities: Mild bilateral lower extremity edema. Wounds: Sternal wound is clean and dry. RLE wound is clean and dry.   Chest tubes: no air leak  Lab Results: CBC: Recent Labs    10/28/17 0332 10/28/17 1732 10/29/17 0418  WBC 13.5*  --  15.1*  HGB 9.0* 9.5* 8.7*  HCT 27.1* 28.0* 26.9*  PLT 154  --  217   BMET:  Recent Labs    10/28/17 0332 10/28/17 1732 10/29/17 0418  NA 135 134* 137  K 3.9 4.1 3.8  CL 99* 96* 97*  CO2 28  --  28  GLUCOSE 129* 163* 149*  BUN 14 19 22*  CREATININE 0.88 0.70 0.88  CALCIUM 8.0*  --  8.5*    PT/INR:  Lab Results  Component Value Date   INR 1.38 10/27/2017   INR 1.40 10/25/2017   INR 1.27 10/25/2017     ABG:  INR: Will add last result for INR, ABG once components are confirmed Will add last 4 CBG results once components are confirmed  Assessment/Plan:  1. CV - S/p inferior MI, V fib arrest, cardiogenic shock. On Lopressor 12.5 mg bid and Milrinone drip. Co ox this am decreased to 58.  Echo ordered-await results 2.  Pulmonary - ABG results noted. Chest tube with 300 last 24 hours. . On bipap. Per Dr. Donata Clay, will try high flow oxygen. CXR this am appears stable (bibasilar atelectasis, interstitial edema). Encourage incentive spirometer. Appreciate pulmonary's assistance 3. Volume Overload - On Lasix 40 mg IV bid.  4.  Acute blood loss anemia - H and H slightly decreased to 8.7 and 26.9 5. DM-CBGs 170/172/142. On Insulin  Will restart Metformin once taking more po.  Pre op HGA1C 6.2. 6. ID-On Zosyn. WBC slightly increased from 13,500 to 15,100. Remains afebrile.  7. Supplement potassium 8. Will order duplex US of LE to rule out DVT 9. LOC later this afternoon  Donielle M ZimmermanPA-C 10/29/2017,7:59 AM  Pulmonary status improved today, patient up in chair on high flow nasal cannula Maintaining sinus rhythm Taking oral diet Echocardiogram shows normal LV function with PFO which may be contributing to low O2  sats. We will increase milrinone to reduce pulmonary vascular resistance Patient may need structural heart  service assessment for PFO closure.  patient examined and medical record reviewed,agree with above note. Kathlee Nationseter Van Trigt III 10/29/2017

## 2017-10-29 NOTE — Progress Notes (Signed)
Trial off BIPAP. Placed on 6lpm [place].  No distress noted at this time. Spo2 97%. RN aware.

## 2017-10-29 NOTE — Progress Notes (Signed)
PULMONARY / CRITICAL CARE MEDICINE   Name: Eddie Black MRN: [id number] DOB: [birth year]    ADMISSION DATE:  10/25/2017 CONSULTATION DATE: 10/28/2017  REFERRING MD:  Donata Clay CHIEF COMPLAINT: shortness of breath   HISTORY OF PRESENT ILLNESS:   Eddie Black is a 57 yr old who was admitted post vfib arrest on 10/25/2017 and was found to have multivessel disease and was taken for emergent CABG X3. Was extubated post operatively but started desaturating today from 4 litres oxygen to NRBFM and BIPAP 100% FIO2 on mobilization. I was called for stat consult to assess the patient. Patient is on BIPAP sitting on the chair in moderate distress but feeling somewhat better. Denies chest pain, fever. Wife at bedside.   He is known with DM, HTN Hyperlipidemia. He is also a smoker of more than 3yrs.    Interval Hx: Patient is back on BIPAP this morning. He was on nasal canula at 6 litres for a couple of hours then started desaturating. Now on 60% on the BIPAP. He states that he did not feel that he was short of breath and feels overall better. Diuresing well. Started on Abx yesterday.    PAST MEDICAL HISTORY :  He  has a past medical history of Diabetes mellitus (HCC), Hyperlipemia, Hypertension, and Tobacco abuse.  PAST SURGICAL HISTORY: He  has a past surgical history that includes LEFT HEART CATH AND CORONARY ANGIOGRAPHY (N/A, 10/25/2017); IABP Insertion (N/A, 10/25/2017); Coronary artery bypass graft (N/A, 10/25/2017); and TEE without cardioversion (N/A, 10/25/2017).  No Known Allergies  No current facility-administered medications on file prior to encounter.    Current Outpatient Medications on File Prior to Encounter  Medication Sig  . atorvastatin (LIPITOR) 40 MG tablet Take 40 mg by mouth at bedtime.  . enalapril (VASOTEC) 20 MG tablet Take 20 mg by mouth at bedtime.  Marland Kitchen ibuprofen (ADVIL,MOTRIN) 800 MG tablet Take 800 mg by mouth 3 (three) times daily as needed (pain).   Marland Kitchen levothyroxine  (SYNTHROID, LEVOTHROID) 25 MCG tablet Take 25 mcg by mouth daily.  . metFORMIN (GLUCOPHAGE) 500 MG tablet Take 500 mg by mouth 2 (two) times daily with a meal.  . Omega-3 Fatty Acids (FISH OIL TRIPLE STRENGTH PO) Take 1 capsule by mouth 2 (two) times daily.    FAMILY HISTORY:  His has no family status information on file.    SOCIAL HISTORY: He  reports that he has been smoking.  He does not have any smokeless tobacco history on file. He reports that he drinks alcohol. He reports that he has current or past drug history. Drug: Marijuana.  REVIEW OF SYSTEMS:   All 11 point system review were unremarkable other than what is mentioned in HPI    VITAL SIGNS: BP 124/70   Pulse 80   Temp 98.5 F (36.9 C) (Axillary)   Resp 17   Ht 6\' 1"  (1.854 m)   Wt 108.3 kg (238 lb 12.1 oz)   SpO2 94%   BMI 31.50 kg/m   HEMODYNAMICS:    VENTILATOR SETTINGS: FiO2 (%):  [60 %-100 %] 60 %  INTAKE / OUTPUT: I/O last 3 completed shifts: In: 452.1 [I.V.:202.1; IV Piggyback:250] Out: 3815 [Urine:3445; Chest Tube:370]  PHYSICAL EXAMINATION: General:  On BIPAP does not look in distress Neuro:  Alert oriented moving all extremities  HEENT:  Moist  Cardiovascular:  Normal heart sounds no murmurs  Lungs:  Bilateral basal coarse crackles  Abdomen: soft no tenderness no megally  Musculoskeletal:  + edema  Skin:  No rash   LABS:  BMET Recent Labs  Lab 10/27/17 0351  10/28/17 0332 10/28/17 1732 10/29/17 0418  NA 137   < > 135 134* 137  K 3.3*   < > 3.9 4.1 3.8  CL 104   < > 99* 96* 97*  CO2 26  --  28  --  28  BUN 8   < > 14 19 22*  CREATININE 0.83   < > 0.88 0.70 0.88  GLUCOSE 133*   < > 129* 163* 149*   < > = values in this interval not displayed.    Electrolytes Recent Labs  Lab 10/26/17 0431 10/26/17 1538 10/27/17 0351 10/28/17 0332 10/29/17 0418  CALCIUM 7.8*  --  7.8* 8.0* 8.5*  MG 2.4 1.9  --   --   --     CBC Recent Labs  Lab 10/27/17 0351  10/28/17 0332  10/28/17 1732 10/29/17 0418  WBC 14.3*  --  13.5*  --  15.1*  HGB 9.2*   < > 9.0* 9.5* 8.7*  HCT 28.3*   < > 27.1* 28.0* 26.9*  PLT 168  --  154  --  217   < > = values in this interval not displayed.    Coag's Recent Labs  Lab 10/25/17 1529 10/25/17 2254 10/27/17 0351  APTT >200* 27 53*  INR 1.27 1.40 1.38    Sepsis Markers No results for input(s): LATICACIDVEN, PROCALCITON, O2SATVEN in the last 168 hours.  ABG Recent Labs  Lab 10/28/17 1232 10/28/17 1328 10/28/17 1730  PHART 7.428 7.438 7.459*  PCO2ART 39.1 38.7 39.3  PO2ART 45.0* 52.0* 58.0*    Liver Enzymes Recent Labs  Lab 10/25/17 1529 10/28/17 0332 10/29/17 0418  AST 170* 25 21  ALT 259* 52 39  ALKPHOS 73 49 53  BILITOT 0.9 0.8 0.6  ALBUMIN 3.4* 2.8* 2.8*    Cardiac Enzymes Recent Labs  Lab 10/25/17 1529  TROPONINI 0.10*    Glucose Recent Labs  Lab 10/28/17 0754 10/28/17 1116 10/28/17 1946 10/28/17 2342 10/29/17 0328 10/29/17 0800  GLUCAP 120* 152* 170* 172* 142* 123*    Imaging Dg Chest Port 1 View  Result Date: 10/29/2017 CLINICAL DATA:  Status post CABG. Current smoker. History of diabetes and hypertension. EXAM: PORTABLE CHEST 1 VIEW COMPARISON:  Portable chest x-ray of October 28, 2017 FINDINGS: The lungs are adequately inflated. The lung markings remain coarse at the bases but have improved slightly. The interstitial markings elsewhere slightly more conspicuous today. The heart is normal in size. The pulmonary vascularity is not engorged. There is calcification in the wall of the aortic arch. The left chest tube is in stable position as is the mediastinal drain. The right PICC line tip projects over the midportion of the SVC. IMPRESSION: Bibasilar atelectasis slightly less conspicuous today. Slight increase in the interstitial markings suggests low-grade interstitial edema. No significant cardiomegaly. The remaining support tubes are in reasonable position. Electronically Signed   By:  David  Swazil[place] M.D.   On: 10/29/2017 07:59   Dg Chest Port 1 View  Result Date: 10/28/2017 CLINICAL DATA:  Respiratory distress. Recent coronary artery bypass grafting EXAM: PORTABLE CHEST 1 VIEW COMPARISON:  October 28, 2017 study obtained earlier in the day FINDINGS: Cordis tip is near the junction of the right jugular vein and superior vena cava, stable. There is a chest tube on the left. There is a mediastinal drain. Temporary pacemaker wires are attached to the right heart. No pneumothorax. There is consolidation  and atelectatic change in the left base. There is patchy infiltrate in the medial right base. Heart size is upper normal with pulmonary vascularity within normal limits. No adenopathy. There is aortic atherosclerosis. IMPRESSION: Tube and catheter positions as described without evident pneumothorax. Consolidation medial left base with atelectatic change also present in the left base. There is medial airspace consolidation in the right base, a new finding. Stable cardiac silhouette. There is aortic atherosclerosis. Aortic Atherosclerosis (ICD10-I70.0). Electronically Signed   By: Bretta Bang III M.D.   On: 10/28/2017 12:15   [place] Ekg Site Rite  Result Date: 10/28/2017 If Site Rite image not attached, placement could not be confirmed due to current cardiac rhythm.    STUDIES:  Echo 10/25/2017 EF50-55%     ANTIBIOTICS: Cefazolin Start zosyn 4/1   DISCUSSION: 57 yr old male with post vfib arrest s/p CABG x3 on 3/29, DM, HTN tobacco abuser now with acute hypoxemic respiratory failure secondary to COPD exacerbation and acute pulmonary edema and acute diastolic heart failure. Patient does have some infiltrates suggestive of aspiration pneumonia   ASSESSMENT / PLAN:  PULMONARY A: acute hypoxemic resp failure requiring non invasive mechanical ventilation  COPD exacerbation Aspiration pneumonia Acute pulm edema   P:   - IV solumedrol  - nebs - iv lasix q12hrs keep in negative  balance  - repeat BAG - BIPAP and switch to high flow nasal canula and reassess   - incentive spirometry    CARDIOVASCULAR A:  V fib arrest CAD  S/p CABGX3 3/29   P:  - milrinone drip - diurese with IV lasix q12hrs  - as per thoracic Sx  RENAL A:    P:   Replace electrolytes as needed   GASTROINTESTINAL A:    P:   NPO for possible intubation   HEMATOLOGIC A:   Leukocytosis possible related to pneumonia  P:  Trend CBC   INFECTIOUS A:   Asp PNA   P:   Continue  zosyn  Sputum culture follow up   ENDOCRINE A:   DM    P:   Sliding scale   NEUROLOGIC A:   Intact    FAMILY  - Updates: wife at bedside updated   - Inter-disciplinary family meet or Palliative Care meeting due by: 4/8   Pulmonary and Critical Care Medicine [facility] Pager: [phone]  10/29/2017, 8:23 AM

## 2017-10-29 NOTE — Progress Notes (Signed)
Patient ID: Eddie Black, male   DOB: 07/29/1961, 57 y.o.   MRN: [id number] EVENING ROUNDS NOTE :     [address]       [place] [id number]             [phone]                 4 Days Post-Op Procedure(s) (LRB): CORONARY ARTERY BYPASS GRAFTING (CABG) times three, WITH ENDOSCOPIC HARVESTING OF RIGHT SAPHENOUS VEIN (N/A) TRANSESOPHAGEAL ECHOCARDIOGRAM (TEE) (N/A)  Total Length of Stay:  LOS: 4 days  BP 130/68   Pulse 86   Temp 98.4 F (36.9 C) (Oral)   Resp 20   Ht 6\' 1"  (1.854 m)   Wt 238 lb 12.1 oz (108.3 kg)   SpO2 95%   BMI 31.50 kg/m   .Intake/Output      04/01 0701 - 04/02 0700 04/02 0701 - 04/03 0700   P.O.     I.V. (mL/kg) 102.5 (0.9) 55.8 (0.5)   IV Piggyback 150 200   Total Intake(mL/kg) 252.5 (2.3) 255.8 (2.4)   Urine (mL/kg/hr) 2410 (0.9) 1475 (1.2)   Chest Tube 300    Total Output 2710 1475   Net -2457.5 -1219.2          . sodium chloride    . sodium chloride 10 mL/hr at 10/27/17 1630  . sodium chloride    . lactated ringers 10 mL/hr at 10/27/17 1100  . milrinone 0.25 mcg/kg/min (10/29/17 1700)  . piperacillin-tazobactam (ZOSYN)  IV Stopped (10/29/17 1546)     Lab Results  Component Value Date   WBC 18.3 (H) 10/29/2017   HGB 8.8 (L) 10/29/2017   HCT 26.0 (L) 10/29/2017   PLT 256 10/29/2017   GLUCOSE 159 (H) 10/29/2017   CHOL 125 10/25/2017   TRIG 40 10/25/2017   HDL 42 10/25/2017   LDLCALC 75 10/25/2017   ALT 39 10/29/2017   AST 21 10/29/2017   NA 136 10/29/2017   K 3.7 10/29/2017   CL 97 (L) 10/29/2017   CREATININE 0.88 10/29/2017   BUN 25 (H) 10/29/2017   CO2 27 10/29/2017   INR 1.38 10/27/2017   HGBA1C 6.2 (H) 10/25/2017   o2 sat better today  Up to chair  Off bipap  Delight OvensEdward B Britini Garcilazo MD  Beeper [phone] Office [phone] 10/29/2017 6:24 PM

## 2017-10-29 NOTE — Progress Notes (Signed)
Preliminary notes by tech--Bilateral lower extremities venous duplex study completed. Negative for DVT. No popliteal fossa cyst seen. Result notified RN Emer.   Hongying Wakisha Alberts(RDMS RVT  10/29/17 11:19 AM

## 2017-10-30 ENCOUNTER — Inpatient Hospital Stay (HOSPITAL_COMMUNITY): Payer: BLUE CROSS/BLUE SHIELD

## 2017-10-30 ENCOUNTER — Other Ambulatory Visit (HOSPITAL_COMMUNITY): Payer: BLUE CROSS/BLUE SHIELD

## 2017-10-30 DIAGNOSIS — I469 Cardiac arrest, cause unspecified: Secondary | ICD-10-CM

## 2017-10-30 DIAGNOSIS — E876 Hypokalemia: Secondary | ICD-10-CM

## 2017-10-30 DIAGNOSIS — I472 Ventricular tachycardia: Secondary | ICD-10-CM

## 2017-10-30 LAB — CBC
HCT: 24.1 % — ABNORMAL LOW (ref 39.0–52.0)
Hemoglobin: 8.1 g/dL — ABNORMAL LOW (ref 13.0–17.0)
MCH: 30.9 pg (ref 26.0–34.0)
MCHC: 33.6 g/dL (ref 30.0–36.0)
MCV: 92 fL (ref 78.0–100.0)
Platelets: 266 10*3/uL (ref 150–400)
RBC: 2.62 MIL/uL — ABNORMAL LOW (ref 4.22–5.81)
RDW: 13.8 % (ref 11.5–15.5)
WBC: 12.7 10*3/uL — ABNORMAL HIGH (ref 4.0–10.5)

## 2017-10-30 LAB — COMPREHENSIVE METABOLIC PANEL
ALT: 33 U/L (ref 17–63)
AST: 19 U/L (ref 15–41)
Albumin: 2.7 g/dL — ABNORMAL LOW (ref 3.5–5.0)
Alkaline Phosphatase: 49 U/L (ref 38–126)
Anion gap: 12 (ref 5–15)
BUN: 25 mg/dL — ABNORMAL HIGH (ref 6–20)
CO2: 28 mmol/L (ref 22–32)
Calcium: 8.2 mg/dL — ABNORMAL LOW (ref 8.9–10.3)
Chloride: 95 mmol/L — ABNORMAL LOW (ref 101–111)
Creatinine, Ser: 0.87 mg/dL (ref 0.61–1.24)
GFR calc Af Amer: >60 mL/min (ref >60)
GFR calc non Af Amer: >60 mL/min (ref >60)
Glucose, Bld: 121 mg/dL — ABNORMAL HIGH (ref 65–99)
Potassium: 3.4 mmol/L — ABNORMAL LOW (ref 3.5–5.1)
Sodium: 135 mmol/L (ref 135–145)
Total Bilirubin: 0.8 mg/dL (ref 0.3–1.2)
Total Protein: 5.1 g/dL — ABNORMAL LOW (ref 6.5–8.1)

## 2017-10-30 LAB — GLUCOSE, CAPILLARY
Glucose-Capillary: 115 mg/dL — ABNORMAL HIGH (ref 65–99)
Glucose-Capillary: 129 mg/dL — ABNORMAL HIGH (ref 65–99)
Glucose-Capillary: 134 mg/dL — ABNORMAL HIGH (ref 65–99)
Glucose-Capillary: 161 mg/dL — ABNORMAL HIGH (ref 65–99)
Glucose-Capillary: 171 mg/dL — ABNORMAL HIGH (ref 65–99)
Glucose-Capillary: 184 mg/dL — ABNORMAL HIGH (ref 65–99)
Glucose-Capillary: 96 mg/dL (ref 65–99)

## 2017-10-30 LAB — COOXEMETRY PANEL
Carboxyhemoglobin: 0.9 % (ref 0.5–1.5)
Methemoglobin: 1.7 % — ABNORMAL HIGH (ref 0.0–1.5)
O2 Saturation: 51.1 %
Total hemoglobin: 7.9 g/dL — ABNORMAL LOW (ref 12.0–16.0)

## 2017-10-30 MED ORDER — INSULIN DETEMIR 100 UNIT/ML ~~LOC~~ SOLN
10.0000 [IU] | Freq: Two times a day (BID) | SUBCUTANEOUS | Status: DC
  Administered 2017-10-30 (×2): 10 [IU] via SUBCUTANEOUS
  Filled 2017-10-30 (×3): qty 0.1

## 2017-10-30 MED ORDER — LEVALBUTEROL HCL 0.63 MG/3ML IN NEBU
0.6300 mg | INHALATION_SOLUTION | Freq: Four times a day (QID) | RESPIRATORY_TRACT | Status: DC
  Administered 2017-10-30 – 2017-10-31 (×7): 0.63 mg via RESPIRATORY_TRACT
  Filled 2017-10-30 (×7): qty 3

## 2017-10-30 MED ORDER — POTASSIUM CHLORIDE 10 MEQ/50ML IV SOLN
10.0000 meq | INTRAVENOUS | Status: AC
  Administered 2017-10-30 (×3): 10 meq via INTRAVENOUS
  Filled 2017-10-30 (×2): qty 50

## 2017-10-30 MED ORDER — POTASSIUM CHLORIDE CRYS ER 20 MEQ PO TBCR
20.0000 meq | EXTENDED_RELEASE_TABLET | ORAL | Status: AC
  Administered 2017-10-30 (×4): 20 meq via ORAL
  Filled 2017-10-30 (×3): qty 1

## 2017-10-30 MED ORDER — GUAIFENESIN ER 600 MG PO TB12
600.0000 mg | ORAL_TABLET | Freq: Two times a day (BID) | ORAL | Status: DC
  Administered 2017-10-30 – 2017-11-03 (×9): 600 mg via ORAL
  Filled 2017-10-30 (×9): qty 1

## 2017-10-30 MED ORDER — METHYLPREDNISOLONE SODIUM SUCC 40 MG IJ SOLR
40.0000 mg | Freq: Four times a day (QID) | INTRAMUSCULAR | Status: DC
  Administered 2017-10-30 – 2017-11-01 (×9): 40 mg via INTRAVENOUS
  Filled 2017-10-30 (×9): qty 1

## 2017-10-30 MED ORDER — FE FUMARATE-B12-VIT C-FA-IFC PO CAPS
1.0000 | ORAL_CAPSULE | Freq: Two times a day (BID) | ORAL | Status: DC
  Administered 2017-10-30 – 2017-11-03 (×9): 1 via ORAL
  Filled 2017-10-30 (×9): qty 1

## 2017-10-30 MED ORDER — DM-GUAIFENESIN ER 30-600 MG PO TB12: 1.0000 | ORAL_TABLET | Freq: Two times a day (BID) | ORAL | Status: DC

## 2017-10-30 NOTE — Progress Notes (Signed)
DAILY PROGRESS NOTE   Patient Name: Briana Newman Date of Encounter: 10/30/2017  Chief Complaint   Less short of breath  Patient Profile   Belinda Bringhurst is a 57 y.o. male with a history of newly diagnosed DM, HTN, HLD and h/o tobacco abuse, presenting to the [facility] ED via EMS for out of hospital Vfib arrest. ROSC achieved with CPR + defibrillation. Post defibrillation EKG showed atrial fibrillation with widespread ST elevations.    Subjective   Oxygen requirement has decreased. Concern about "large PFO" on echo. I personally reviewed the studies from 3/29 and 4/2 as well as the interoperative TEE. No evidence for PFO by color doppler on the TEE. There is venous flow in the subcostal images, with the color jet width measured on the 4/2 study - this likely represents the IVC when compared to the images from 3/29. Diuresed another 2.1L negative overnight. Creatinine stable.  Objective   Vitals:   10/30/17 0730 10/30/17 0742 10/30/17 0800 10/30/17 0824  BP:   121/68   Pulse:   79   Resp:   20   Temp: 98.6 F (37 C)     TempSrc: Oral     SpO2:  98% 97% 99%  Weight:      Height:        Intake/Output Summary (Last 24 hours) at 10/30/2017 0955 Last data filed at 10/30/2017 0900 Gross per 24 hour  Intake 429.67 ml  Output 2490 ml  Net -2060.33 ml   Filed Weights   10/28/17 0800 10/29/17 0500 10/30/17 0600  Weight: 241 lb 2.9 oz (109.4 kg) 238 lb 12.1 oz (108.3 kg) 229 lb 15 oz (104.3 kg)    Physical Exam   General appearance: alert, no distress and morbidly obese Neck: no carotid bruit, no JVD and thyroid not enlarged, symmetric, no tenderness/mass/nodules Lungs: diminished breath sounds bilaterally Heart: regular rate and rhythm Abdomen: soft, non-tender; bowel sounds normal; no masses,  no organomegaly and obese Extremities: edema 1+ Pulses: 2+ and symmetric Skin: Skin color, texture, turgor normal. No rashes or lesions Neurologic: Grossly normal Psych:  Pleasant  Inpatient Medications    Scheduled Meds: . acetaminophen  1,000 mg Oral Q6H   Or  . acetaminophen (TYLENOL) oral liquid 160 mg/5 mL  1,000 mg Per Tube Q6H  . aspirin EC  325 mg Oral Daily   Or  . aspirin  324 mg Per Tube Daily  . bisacodyl  10 mg Oral Daily   Or  . bisacodyl  10 mg Rectal Daily  . Chlorhexidine Gluconate Cloth  6 each Topical Daily  . docusate sodium  200 mg Oral Daily  . enoxaparin (LOVENOX) injection  40 mg Subcutaneous Q24H  . feeding supplement (GLUCERNA SHAKE)  237 mL Oral TID BM  . ferrous [id number] C-folic acid  1 capsule Oral BID  . furosemide  40 mg Intravenous BID  . guaiFENesin  600 mg Oral BID  . insulin detemir  10 Units Subcutaneous BID  . levalbuterol  0.63 mg Nebulization Q6H  . mouth rinse  15 mL Mouth Rinse BID  . methylPREDNISolone (SOLU-MEDROL) injection  40 mg Intravenous Q6H  . metoCLOPramide (REGLAN) injection  10 mg Intravenous Q6H  . metoprolol tartrate  12.5 mg Oral BID   Or  . metoprolol tartrate  12.5 mg Per Tube BID  . pantoprazole  40 mg Oral Daily  . potassium chloride  20 mEq Oral Q4H  . sodium chloride flush  10-40 mL Intracatheter Q12H  .  sodium chloride flush  3 mL Intravenous Q12H    Continuous Infusions: . sodium chloride    . sodium chloride 10 mL/hr at 10/27/17 1630  . sodium chloride    . lactated ringers 10 mL/hr at 10/27/17 1100  . milrinone 0.124 mcg/kg/min (10/30/17 0900)  . piperacillin-tazobactam (ZOSYN)  IV Stopped (10/30/17 0555)  . potassium chloride 10 mEq (10/30/17 0946)    PRN Meds: Place/Maintain arterial line **AND** sodium chloride, metoprolol tartrate, ondansetron (ZOFRAN) IV, oxyCODONE, pneumococcal 23 valent vaccine, sodium chloride flush, sodium chloride flush, sodium chloride flush, traMADol   Labs   Results for orders placed or performed during the hospital encounter of 10/25/17 (from the past 48 hour(s))  Glucose, capillary     Status: Abnormal   Collection Time:  10/28/17 11:16 AM  Result Value Ref Range   Glucose-Capillary 152 (H) 65 - 99 mg/dL   Comment 1 Capillary Specimen   I-STAT 3, arterial blood gas (G3+)     Status: Abnormal   Collection Time: 10/28/17 12:32 PM  Result Value Ref Range   pH, Arterial 7.428 7.350 - 7.450   pCO2 arterial 39.1 32.0 - 48.0 mmHg   pO2, Arterial 45.0 (L) 83.0 - 108.0 mmHg   Bicarbonate 25.8 20.0 - 28.0 mmol/L   TCO2 27 22 - 32 mmol/L   O2 Saturation 81.0 %   Acid-Base Excess 1.0 0.0 - 2.0 mmol/L   Patient temperature 98.9 F    Collection site RADIAL, ALLEN'S TEST ACCEPTABLE    Drawn by RT    Sample type ARTERIAL   I-STAT 3, arterial blood gas (G3+)     Status: Abnormal   Collection Time: 10/28/17  1:28 PM  Result Value Ref Range   pH, Arterial 7.438 7.350 - 7.450   pCO2 arterial 38.7 32.0 - 48.0 mmHg   pO2, Arterial 52.0 (L) 83.0 - 108.0 mmHg   Bicarbonate 26.1 20.0 - 28.0 mmol/L   TCO2 27 22 - 32 mmol/L   O2 Saturation 87.0 %   Acid-Base Excess 2.0 0.0 - 2.0 mmol/L   Patient temperature 98.9 F    Collection site RADIAL, ALLEN'S TEST ACCEPTABLE    Drawn by RT    Sample type ARTERIAL   I-STAT 3, arterial blood gas (G3+)     Status: Abnormal   Collection Time: 10/28/17  5:30 PM  Result Value Ref Range   pH, Arterial 7.459 (H) 7.350 - 7.450   pCO2 arterial 39.3 32.0 - 48.0 mmHg   pO2, Arterial 58.0 (L) 83.0 - 108.0 mmHg   Bicarbonate 27.8 20.0 - 28.0 mmol/L   TCO2 29 22 - 32 mmol/L   O2 Saturation 91.0 %   Acid-Base Excess 4.0 (H) 0.0 - 2.0 mmol/L   Patient temperature 98.9 F    Collection site RADIAL, ALLEN'S TEST ACCEPTABLE    Drawn by RT    Sample type ARTERIAL   I-STAT, chem 8     Status: Abnormal   Collection Time: 10/28/17  5:32 PM  Result Value Ref Range   Sodium 134 (L) 135 - 145 mmol/L   Potassium 4.1 3.5 - 5.1 mmol/L   Chloride 96 (L) 101 - 111 mmol/L   BUN 19 6 - 20 mg/dL   Creatinine, Ser 0.70 0.61 - 1.24 mg/dL   Glucose, Bld 163 (H) 65 - 99 mg/dL   Calcium, Ion 1.12 (L) 1.15 -  1.40 mmol/L   TCO2 27 22 - 32 mmol/L   Hemoglobin 9.5 (L) 13.0 - 17.0 g/dL  HCT 28.0 (L) 39.0 - 52.0 %  Glucose, capillary     Status: Abnormal   Collection Time: 10/28/17  7:46 PM  Result Value Ref Range   Glucose-Capillary 170 (H) 65 - 99 mg/dL  Glucose, capillary     Status: Abnormal   Collection Time: 10/28/17 11:42 PM  Result Value Ref Range   Glucose-Capillary 172 (H) 65 - 99 mg/dL  Culture, expectorated sputum-assessment     Status: None (Preliminary result)   Collection Time: 10/29/17 12:45 AM  Result Value Ref Range   Specimen Description EXPECTORATED SPUTUM    Special Requests NONE    Sputum evaluation      THIS SPECIMEN IS ACCEPTABLE FOR SPUTUM CULTURE Performed at [facility], [place] [address], [place], [place] [address]    Report Status PENDING   Culture, respiratory (NON-Expectorated)     Status: None (Preliminary result)   Collection Time: 10/29/17 12:45 AM  Result Value Ref Range   Specimen Description EXPECTORATED SPUTUM    Special Requests NONE Reflexed from [id number]    Gram Stain      ABUNDANT WBC PRESENT, PREDOMINANTLY PMN RARE GRAM POSITIVE COCCI    Culture      CULTURE REINCUBATED FOR BETTER GROWTH Performed at [facility], [place] [address] [place], [place] [address]    Report Status PENDING   Glucose, capillary     Status: Abnormal   Collection Time: 10/29/17  3:28 AM  Result Value Ref Range   Glucose-Capillary 142 (H) 65 - 99 mg/dL   Comment 1 Capillary Specimen   .Cooxemetry Panel (carboxy, met, total hgb, O2 sat)     Status: Abnormal   Collection Time: 10/29/17  4:15 AM  Result Value Ref Range   Total hemoglobin 9.0 (L) 12.0 - 16.0 g/dL   O2 Saturation 58.0 %   Carboxyhemoglobin 0.7 0.5 - 1.5 %   Methemoglobin 1.7 (H) 0.0 - 1.5 %  CBC     Status: Abnormal   Collection Time: 10/29/17  4:18 AM  Result Value Ref Range   WBC 15.1 (H) 4.0 - 10.5 K/uL   RBC 2.96 (L) 4.22 - 5.81 MIL/uL   Hemoglobin 8.7 (L) 13.0 - 17.0 g/dL   HCT 26.9  (L) 39.0 - 52.0 %   MCV 90.9 78.0 - 100.0 fL   MCH 29.4 26.0 - 34.0 pg   MCHC 32.3 30.0 - 36.0 g/dL   RDW 13.3 11.5 - 15.5 %   Platelets 217 150 - 400 K/uL    Comment: Performed at [facility], [place]. [address] [place], [place] [address]  Comprehensive metabolic panel     Status: Abnormal   Collection Time: 10/29/17  4:18 AM  Result Value Ref Range   Sodium 137 135 - 145 mmol/L   Potassium 3.8 3.5 - 5.1 mmol/L   Chloride 97 (L) 101 - 111 mmol/L   CO2 28 22 - 32 mmol/L   Glucose, Bld 149 (H) 65 - 99 mg/dL   BUN 22 (H) 6 - 20 mg/dL   Creatinine, Ser 0.88 0.61 - 1.24 mg/dL   Calcium 8.5 (L) 8.9 - 10.3 mg/dL   Total Protein 5.7 (L) 6.5 - 8.1 g/dL   Albumin 2.8 (L) 3.5 - 5.0 g/dL   AST 21 15 - 41 U/L   ALT 39 17 - 63 U/L   Alkaline Phosphatase 53 38 - 126 U/L   Total Bilirubin 0.6 0.3 - 1.2 mg/dL   GFR calc non Af Amer >  60 >60 mL/min   GFR calc Af Amer >60 >60 mL/min    Comment: (NOTE) The eGFR has been calculated using the CKD EPI equation. This calculation has not been validated in all clinical situations. eGFR's persistently <60 mL/min signify possible Chronic Kidney Disease.    Anion gap 12 5 - 15    Comment: Performed at [facility] [address] [place], [place] [address]  Glucose, capillary     Status: Abnormal   Collection Time: 10/29/17  8:00 AM  Result Value Ref Range   Glucose-Capillary 123 (H) 65 - 99 mg/dL   Comment 1 Notify RN   CBC with Differential/Platelet     Status: Abnormal   Collection Time: 10/29/17  8:51 AM  Result Value Ref Range   WBC 18.3 (H) 4.0 - 10.5 K/uL   RBC 2.86 (L) 4.22 - 5.81 MIL/uL   Hemoglobin 8.8 (L) 13.0 - 17.0 g/dL   HCT 26.0 (L) 39.0 - 52.0 %   MCV 90.9 78.0 - 100.0 fL   MCH 30.8 26.0 - 34.0 pg   MCHC 33.8 30.0 - 36.0 g/dL   RDW 13.5 11.5 - 15.5 %   Platelets 256 150 - 400 K/uL   Neutrophils Relative % 81 %   Neutro Abs 15.0 (H) 1.7 - 7.7 K/uL   Lymphocytes Relative 7 %   Lymphs Abs 1.2 0.7 - 4.0 K/uL    Monocytes Relative 12 %   Monocytes Absolute 2.1 (H) 0.1 - 1.0 K/uL   Eosinophils Relative 0 %   Eosinophils Absolute 0.0 0.0 - 0.7 K/uL   Basophils Relative 0 %   Basophils Absolute 0.0 0.0 - 0.1 K/uL    Comment: Performed at [facility], [address] [place], [place] [address]  Basic metabolic panel     Status: Abnormal   Collection Time: 10/29/17  8:51 AM  Result Value Ref Range   Sodium 136 135 - 145 mmol/L   Potassium 3.7 3.5 - 5.1 mmol/L   Chloride 97 (L) 101 - 111 mmol/L   CO2 27 22 - 32 mmol/L   Glucose, Bld 159 (H) 65 - 99 mg/dL   BUN 25 (H) 6 - 20 mg/dL   Creatinine, Ser 0.88 0.61 - 1.24 mg/dL   Calcium 8.3 (L) 8.9 - 10.3 mg/dL   GFR calc non Af Amer >60 >60 mL/min   GFR calc Af Amer >60 >60 mL/min    Comment: (NOTE) The eGFR has been calculated using the CKD EPI equation. This calculation has not been validated in all clinical situations. eGFR's persistently <60 mL/min signify possible Chronic Kidney Disease.    Anion gap 12 5 - 15    Comment: Performed at [facility] [address], [place], [place] [address]  Glucose, capillary     Status: Abnormal   Collection Time: 10/29/17 11:14 AM  Result Value Ref Range   Glucose-Capillary 176 (H) 65 - 99 mg/dL   Comment 1 Notify RN   Glucose, capillary     Status: Abnormal   Collection Time: 10/29/17  4:23 PM  Result Value Ref Range   Glucose-Capillary 150 (H) 65 - 99 mg/dL   Comment 1 Notify RN   Glucose, capillary     Status: Abnormal   Collection Time: 10/29/17  7:17 PM  Result Value Ref Range   Glucose-Capillary 160 (H) 65 - 99 mg/dL   Comment 1 Notify RN   Glucose, capillary     Status: Abnormal  Collection Time: 10/29/17 11:37 PM  Result Value Ref Range   Glucose-Capillary 129 (H) 65 - 99 mg/dL   Comment 1 Notify RN   Glucose, capillary     Status: None   Collection Time: 10/30/17  3:47 AM  Result Value Ref Range   Glucose-Capillary 96 65 - 99 mg/dL   Comment 1 Notify RN   CBC      Status: Abnormal   Collection Time: 10/30/17  5:00 AM  Result Value Ref Range   WBC 12.7 (H) 4.0 - 10.5 K/uL   RBC 2.62 (L) 4.22 - 5.81 MIL/uL   Hemoglobin 8.1 (L) 13.0 - 17.0 g/dL   HCT 24.1 (L) 39.0 - 52.0 %   MCV 92.0 78.0 - 100.0 fL   MCH 30.9 26.0 - 34.0 pg   MCHC 33.6 30.0 - 36.0 g/dL   RDW 13.8 11.5 - 15.5 %   Platelets 266 150 - 400 K/uL    Comment: Performed at [facility], [place]. [address] [place] [address] [place], [place] [address]  Comprehensive metabolic panel     Status: Abnormal   Collection Time: 10/30/17  5:00 AM  Result Value Ref Range   Sodium 135 135 - 145 mmol/L   Potassium 3.4 (L) 3.5 - 5.1 mmol/L   Chloride 95 (L) 101 - 111 mmol/L   CO2 28 22 - 32 mmol/L   Glucose, Bld 121 (H) 65 - 99 mg/dL   BUN 25 (H) 6 - 20 mg/dL   Creatinine, Ser 0.87 0.61 - 1.24 mg/dL   Calcium 8.2 (L) 8.9 - 10.3 mg/dL   Total Protein 5.1 (L) 6.5 - 8.1 g/dL   Albumin 2.7 (L) 3.5 - 5.0 g/dL   AST 19 15 - 41 U/L   ALT 33 17 - 63 U/L   Alkaline Phosphatase 49 38 - 126 U/L   Total Bilirubin 0.8 0.3 - 1.2 mg/dL   GFR calc non Af Amer >60 >60 mL/min   GFR calc Af Amer >60 >60 mL/min    Comment: (NOTE) The eGFR has been calculated using the CKD EPI equation. This calculation has not been validated in all clinical situations. eGFR's persistently <60 mL/min signify possible Chronic Kidney Disease.    Anion gap 12 5 - 15    Comment: Performed at [facility] [address], [place], [place] [address]  .Cooxemetry Panel (carboxy, met, total hgb, O2 sat)     Status: Abnormal   Collection Time: 10/30/17  5:10 AM  Result Value Ref Range   Total hemoglobin 7.9 (L) 12.0 - 16.0 g/dL   O2 Saturation 51.1 %   Carboxyhemoglobin 0.9 0.5 - 1.5 %   Methemoglobin 1.7 (H) 0.0 - 1.5 %  Glucose, capillary     Status: Abnormal   Collection Time: 10/30/17  7:27 AM  Result Value Ref Range   Glucose-Capillary 115 (H) 65 - 99 mg/dL   Comment 1 Notify RN     ECG   N/A  Telemetry   Sinus rhythm -  Personally Reviewed  Radiology    Dg Chest Port 1 View  Result Date: 10/30/2017 CLINICAL DATA:  Coronary disease post CABG, diabetes mellitus, hypertension, smoker EXAM: PORTABLE CHEST 1 VIEW COMPARISON:  Portable exam 0534 hours compared to 10/29/2017 FINDINGS: RIGHT arm PICC line tip projects over cavoatrial junction. LEFT thoracostomy tube. Upper normal size of cardiac silhouette post CABG. Slight pulmonary vascular congestion. Increased perihilar markings likely reflects minimal edema. Generally low inspiratory lung volumes. No pleural effusion or  pneumothorax. Bones unremarkable. IMPRESSION: Question minimal perihilar edema. Electronically Signed   By: Lavonia Dana M.D.   On: 10/30/2017 09:38   Dg Chest Port 1 View  Result Date: 10/29/2017 CLINICAL DATA:  Status post CABG. Current smoker. History of diabetes and hypertension. EXAM: PORTABLE CHEST 1 VIEW COMPARISON:  Portable chest x-ray of October 28, 2017 FINDINGS: The lungs are adequately inflated. The lung markings remain coarse at the bases but have improved slightly. The interstitial markings elsewhere slightly more conspicuous today. The heart is normal in size. The pulmonary vascularity is not engorged. There is calcification in the wall of the aortic arch. The left chest tube is in stable position as is the mediastinal drain. The right PICC line tip projects over the midportion of the SVC. IMPRESSION: Bibasilar atelectasis slightly less conspicuous today. Slight increase in the interstitial markings suggests low-grade interstitial edema. No significant cardiomegaly. The remaining support tubes are in reasonable position. Electronically Signed   By: David  Martinique M.D.   On: 10/29/2017 07:59   Dg Chest Port 1 View  Result Date: 10/28/2017 CLINICAL DATA:  Respiratory distress. Recent coronary artery bypass grafting EXAM: PORTABLE CHEST 1 VIEW COMPARISON:  October 28, 2017 study obtained earlier in the day FINDINGS: Cordis tip is near the junction of  the right jugular vein and superior vena cava, stable. There is a chest tube on the left. There is a mediastinal drain. Temporary pacemaker wires are attached to the right heart. No pneumothorax. There is consolidation and atelectatic change in the left base. There is patchy infiltrate in the medial right base. Heart size is upper normal with pulmonary vascularity within normal limits. No adenopathy. There is aortic atherosclerosis. IMPRESSION: Tube and catheter positions as described without evident pneumothorax. Consolidation medial left base with atelectatic change also present in the left base. There is medial airspace consolidation in the right base, a new finding. Stable cardiac silhouette. There is aortic atherosclerosis. Aortic Atherosclerosis (ICD10-I70.0). Electronically Signed   By: Lowella Grip III M.D.   On: 10/28/2017 12:15   [place] Ekg Site Rite  Result Date: 10/28/2017 If Site Rite image not attached, placement could not be confirmed due to current cardiac rhythm.   Cardiac Studies   LV EF: 65% -   70%  ------------------------------------------------------------------- Indications:      CAD of native vessels 414.01.  ------------------------------------------------------------------- History:   Risk factors:  Current tobacco use. Hypertension. Diabetes mellitus. Dyslipidemia.  ------------------------------------------------------------------- Study Conclusions  - Left ventricle: The cavity size was normal. Wall thickness was   normal. Systolic function was vigorous. The estimated ejection   fraction was in the range of 65% to 70%. - Atrial septum: Septum seems mobile with probable large appearing   PFO.  Assessment   Principal Problem:   Cardiac arrest [facility]) Active Problems:   Diabetes mellitus type II, controlled ([place])   Hyperlipidemia LDL goal <70   Essential hypertension   Tobacco abuse   CAD (coronary artery disease), native coronary artery   Ventricular  fibrillation (HCC)   MI, acute, non ST segment elevation (HCC)   S/P CABG x 3   Acute hypoxemic respiratory failure (HCC)   COPD exacerbation (HCC)   Aspiration pneumonia due to gastric secretions (HCC)   Hypokalemia   Plan   1. Improved oxygenation - almost 5L negative diuresis. Remains on milrinone. As stated above, I personally reviewed all of his echo images and also reviewed the images with Dr. Marlou Porch today as well - there is no evidence for  PFO by color doppler (seen well in the interoperative TEE) - I suspect the poor quality of the images lead to misinterpretation of low venous flow in the IVC that was called a PFO. Will order a limited bedside bubble study today, but expect this to be negative.    Time Spent Directly with Patient:  I have spent a total of 25 minutes with the patient reviewing hospital notes, telemetry, EKGs, labs and examining the patient as well as establishing an assessment and plan that was discussed personally with the patient. > 50% of time was spent in direct patient care.  Length of Stay:  LOS: 5 days   Pixie Casino, MD, [facility], [place] Director of the Advanced Lipid Disorders &  Cardiovascular Risk Reduction Clinic Diplomate of the American Board of Clinical Lipidology Attending Cardiologist  Direct Dial: [phone]  Fax: [phone]  Website:  [email or web address] 10/30/2017, 9:55 AM

## 2017-10-30 NOTE — Progress Notes (Addendum)
  Echocardiogram 2D Echocardiogram has been performed.  [name] 10/30/2017, 12:15 PM

## 2017-10-30 NOTE — Progress Notes (Addendum)
      [address]       [place] [id number]             [phone]        5 Days Post-Op Procedure(s) (LRB): CORONARY ARTERY BYPASS GRAFTING (CABG) times three, WITH ENDOSCOPIC HARVESTING OF RIGHT SAPHENOUS VEIN (N/A) TRANSESOPHAGEAL ECHOCARDIOGRAM (TEE) (N/A)  Subjective: Patient on high flow [place], sitting in chair.  Objective: Vital signs in last 24 hours: Temp:  [98.1 F (36.7 C)-98.8 F (37.1 C)] 98.6 F (37 C) (04/03 0730) Pulse Rate:  [66-92] 84 (04/03 0700) Cardiac Rhythm: Normal sinus rhythm (04/03 0400) Resp:  [14-24] 23 (04/03 0700) BP: (101-151)/(60-87) 135/76 (04/03 0700) SpO2:  [90 %-100 %] 95 % (04/03 0700) Weight:  [229 lb 15 oz (104.3 kg)] 229 lb 15 oz (104.3 kg) (04/03 0600)  Pre op weight 110.4 kg Current Weight  10/30/17 229 lb 15 oz (104.3 kg)      Intake/Output from previous day: 04/02 0701 - 04/03 0700 In: 472 [I.V.:172; IV Piggyback:300] Out: 2590 [Urine:2500; Chest Tube:90]   Physical Exam:  Cardiovascular: RRR Pulmonary: On high flow [place], slightly diminished at bases Abdomen: Soft, non tender, sporadic bowel sounds present. Extremities: Mild bilateral lower extremity edema. Wounds: Sternal wound is clean and dry. RLE wound is clean and dry.   Chest tube: no air leak  Lab Results: CBC: Recent Labs    10/29/17 0851 10/30/17 0500  WBC 18.3* 12.7*  HGB 8.8* 8.1*  HCT 26.0* 24.1*  PLT 256 266   BMET:  Recent Labs    10/29/17 0851 10/30/17 0500  NA 136 135  K 3.7 3.4*  CL 97* 95*  CO2 27 28  GLUCOSE 159* 121*  BUN 25* 25*  CREATININE 0.88 0.87  CALCIUM 8.3* 8.2*    PT/INR:  Lab Results  Component Value Date   INR 1.38 10/27/2017   INR 1.40 10/25/2017   INR 1.27 10/25/2017   ABG:  INR: Will add last result for INR, ABG once components are confirmed Will add last 4 CBG results once components are confirmed  Assessment/Plan:  1. CV - S/p inferior MI, V fib arrest, cardiogenic shock. On Lopressor  12.5 mg bid and Milrinone drip. Co ox this am decreased to 51 despite increase in Milrinone yesterday. Echo showed large PFO, which is likely contributing to low oxygenation;likely needs structural heart evaluation for  PFO closure. 2.  Pulmonary - On high flow oxygen-10 liters of oxygen. Chest tube with 90 last 24 hours. Remove today.  CXR this am appears stable (bibasilar atelectasis, interstitial edema). Encourage incentive spirometer. Appreciate pulmonary's assistance 3. Volume Overload - On Lasix 40 mg IV bid.  4.  Acute blood loss anemia - H and H slightly decreased to 8.1 and 24.1. Will start Trinsicon. 5. DM-CBGs 150/160/96. On Insulin  Will restart Metformin soon.  Pre op HGA1C 6.2. 6. ID- WBC slightly decreased from 18,300 to 12,700. Remains afebrile.  7. Supplement potassium 8. Remove foley  Donielle M ZimmermanPA-C 10/30/2017,7:32 AM  drops sats with ambulation - PFO noted postop- ? R ro L flow. Dr Excell Seltzerooper will eval  Reduce mil to .125  patient examined and medical record reviewed,agree with above note. Kathlee Nationseter Van Trigt III 10/30/2017

## 2017-10-30 NOTE — Progress Notes (Signed)
Patient ID: [name], male   DOB: June 30, 1961, 57 y.o.   MRN: [id number] TCTS Evening Rounds:  Hemodynamically stable in sinus rhythm.  sats 91% on 5L HFNC  Continues to diurese well.

## 2017-10-30 NOTE — Progress Notes (Signed)
PULMONARY / CRITICAL CARE MEDICINE   Name: Eddie Black MRN: [id number] DOB: 1961/06/01    ADMISSION DATE:  10/25/2017 CONSULTATION DATE: 10/28/2017  REFERRING MD:  Donata Clay CHIEF COMPLAINT: shortness of breath   HISTORY OF PRESENT ILLNESS:   Eddie Black is a 57 yr old who was admitted post vfib arrest on 10/25/2017 and was found to have multivessel disease and was taken for emergent CABG X3. Was extubated post operatively but started desaturating today from 4 litres oxygen to NRBFM and BIPAP 100% FIO2 on mobilization. I was called for stat consult to assess the patient. Patient is on BIPAP sitting on the chair in moderate distress but feeling somewhat better. Denies chest pain, fever. Wife at bedside.   He is known with DM, HTN Hyperlipidemia. He is also a smoker of more than 85yrs.    Interval Hx: On highflow nasal canula 8-10 litres Feeling better overall Diuresing well  Echo showed large PFO   PAST MEDICAL HISTORY :  He  has a past medical history of Diabetes mellitus (HCC), Hyperlipemia, Hypertension, and Tobacco abuse.  PAST SURGICAL HISTORY: He  has a past surgical history that includes LEFT HEART CATH AND CORONARY ANGIOGRAPHY (N/A, 10/25/2017); IABP Insertion (N/A, 10/25/2017); Coronary artery bypass graft (N/A, 10/25/2017); and TEE without cardioversion (N/A, 10/25/2017).  No Known Allergies  No current facility-administered medications on file prior to encounter.    Current Outpatient Medications on File Prior to Encounter  Medication Sig  . atorvastatin (LIPITOR) 40 MG tablet Take 40 mg by mouth at bedtime.  . enalapril (VASOTEC) 20 MG tablet Take 20 mg by mouth at bedtime.  Marland Kitchen ibuprofen (ADVIL,MOTRIN) 800 MG tablet Take 800 mg by mouth 3 (three) times daily as needed (pain).   Marland Kitchen levothyroxine (SYNTHROID, LEVOTHROID) 25 MCG tablet Take 25 mcg by mouth daily.  . metFORMIN (GLUCOPHAGE) 500 MG tablet Take 500 mg by mouth 2 (two) times daily with a meal.  . Omega-3 Fatty  Acids (FISH OIL TRIPLE STRENGTH PO) Take 1 capsule by mouth 2 (two) times daily.    FAMILY HISTORY:  His has no family status information on file.    SOCIAL HISTORY: He  reports that he has been smoking.  He does not have any smokeless tobacco history on file. He reports that he drinks alcohol. He reports that he has current or past drug history. Drug: Marijuana.  REVIEW OF SYSTEMS:   All 11 point system review were unremarkable other than what is mentioned in HPI    VITAL SIGNS: BP 135/76   Pulse 84   Temp 98.6 F (37 C) (Oral)   Resp (!) 23   Ht 6\' 1"  (1.854 m)   Wt 104.3 kg (229 lb 15 oz)   SpO2 99%   BMI 30.34 kg/m   HEMODYNAMICS:    VENTILATOR SETTINGS:    INTAKE / OUTPUT: I/O last 3 completed shifts: In: 621.2 [I.V.:221.2; IV Piggyback:400] Out: 3370 [Urine:3200; Chest Tube:170]  PHYSICAL EXAMINATION: General:  On BIPAP does not look in distress Neuro:  Alert oriented moving all extremities  HEENT:  Moist  Cardiovascular:  Normal heart sounds no murmurs  Lungs:  Bilateral basal coarse crackles  Abdomen: soft no tenderness no megally  Musculoskeletal:  + edema  Skin:  No rash   LABS:  BMET Recent Labs  Lab 10/29/17 0418 10/29/17 0851 10/30/17 0500  NA 137 136 135  K 3.8 3.7 3.4*  CL 97* 97* 95*  CO2 28 27 28   BUN 22* 25*  25*  CREATININE 0.88 0.88 0.87  GLUCOSE 149* 159* 121*    Electrolytes Recent Labs  Lab 10/26/17 0431 10/26/17 1538  10/29/17 0418 10/29/17 0851 10/30/17 0500  CALCIUM 7.8*  --    < > 8.5* 8.3* 8.2*  MG 2.4 1.9  --   --   --   --    < > = values in this interval not displayed.    CBC Recent Labs  Lab 10/29/17 0418 10/29/17 0851 10/30/17 0500  WBC 15.1* 18.3* 12.7*  HGB 8.7* 8.8* 8.1*  HCT 26.9* 26.0* 24.1*  PLT 217 256 266    Coag's Recent Labs  Lab 10/25/17 1529 10/25/17 2254 10/27/17 0351  APTT >200* 27 53*  INR 1.27 1.40 1.38    Sepsis Markers No results for input(s): LATICACIDVEN,  PROCALCITON, O2SATVEN in the last 168 hours.  ABG Recent Labs  Lab 10/28/17 1232 10/28/17 1328 10/28/17 1730  PHART 7.428 7.438 7.459*  PCO2ART 39.1 38.7 39.3  PO2ART 45.0* 52.0* 58.0*    Liver Enzymes Recent Labs  Lab 10/28/17 0332 10/29/17 0418 10/30/17 0500  AST 25 21 19   ALT 52 39 33  ALKPHOS 49 53 49  BILITOT 0.8 0.6 0.8  ALBUMIN 2.8* 2.8* 2.7*    Cardiac Enzymes Recent Labs  Lab 10/25/17 1529  TROPONINI 0.10*    Glucose Recent Labs  Lab 10/29/17 1114 10/29/17 1623 10/29/17 1917 10/29/17 2337 10/30/17 0347 10/30/17 0727  GLUCAP 176* 150* 160* 129* 96 115*    Imaging No results found.   STUDIES:  Echo 10/25/2017 EF50-55%     ANTIBIOTICS: Cefazolin Start zosyn 4/1   DISCUSSION: 57 yr old male with post vfib arrest s/p CABG x3 on 3/29, DM, HTN tobacco abuser now with acute hypoxemic respiratory failure secondary to COPD exacerbation and acute pulmonary edema and acute diastolic heart failure. Patient does have some infiltrates suggestive of aspiration pneumonia   ASSESSMENT / PLAN:  PULMONARY A: acute hypoxemic resp failure requiring highflow nasal canula secondary to COPD exacerbation and large PFO COPD exacerbation Aspiration pneumonia Acute pulm edema   P:   - IV solumedrol  - nebs will be changed to scheduled from PRN - iv lasix q12hrs keep in negative balance   - incentive spirometry    CARDIOVASCULAR A:  V fib arrest CAD  S/p CABGX3 3/29  Large PFO  P:  -  Cardiology and thoracic surgery opinion about the large PFO which is contributing to his hypoxia and might explain why he is not very symptomatic when his POX drops - diurese with IV lasix q12hrs  - as per thoracic Sx  RENAL A:   Hypokalemia   P:   Replace electrolytes as needed   GASTROINTESTINAL A:    P:      HEMATOLOGIC A:   Leukocytosis possible related to pneumonia  P:  Trending down   INFECTIOUS A:   Asp PNA   P:   Continue  zosyn  Sputum  culture follow up   ENDOCRINE A:   DM    P:   Sliding scale   NEUROLOGIC A:   Intact    FAMILY  - Updates: no family available discussed with patient   - Inter-disciplinary family meet or Palliative Care meeting due by: 4/8   Pulmonary and Critical Care Medicine [facility] Pager: [phone]  10/30/2017, 8:44 AM

## 2017-10-31 ENCOUNTER — Encounter (HOSPITAL_COMMUNITY): Payer: Self-pay | Admitting: *Deleted

## 2017-10-31 ENCOUNTER — Inpatient Hospital Stay (HOSPITAL_COMMUNITY): Payer: BLUE CROSS/BLUE SHIELD

## 2017-10-31 LAB — GLUCOSE, CAPILLARY
Glucose-Capillary: 134 mg/dL — ABNORMAL HIGH (ref 65–99)
Glucose-Capillary: 140 mg/dL — ABNORMAL HIGH (ref 65–99)
Glucose-Capillary: 139 mg/dL — ABNORMAL HIGH (ref 65–99)
Glucose-Capillary: 170 mg/dL — ABNORMAL HIGH (ref 65–99)
Glucose-Capillary: 156 mg/dL — ABNORMAL HIGH (ref 65–99)

## 2017-10-31 LAB — COMPREHENSIVE METABOLIC PANEL
ALT: 34 U/L (ref 17–63)
AST: 21 U/L (ref 15–41)
Albumin: 2.9 g/dL — ABNORMAL LOW (ref 3.5–5.0)
Alkaline Phosphatase: 56 U/L (ref 38–126)
Anion gap: 11 (ref 5–15)
BUN: 23 mg/dL — ABNORMAL HIGH (ref 6–20)
CO2: 26 mmol/L (ref 22–32)
Calcium: 8.6 mg/dL — ABNORMAL LOW (ref 8.9–10.3)
Chloride: 98 mmol/L — ABNORMAL LOW (ref 101–111)
Creatinine, Ser: 0.86 mg/dL (ref 0.61–1.24)
GFR calc Af Amer: >60 mL/min (ref >60)
GFR calc non Af Amer: >60 mL/min (ref >60)
Glucose, Bld: 147 mg/dL — ABNORMAL HIGH (ref 65–99)
Potassium: 4.5 mmol/L (ref 3.5–5.1)
Sodium: 135 mmol/L (ref 135–145)
Total Bilirubin: 1 mg/dL (ref 0.3–1.2)
Total Protein: 5.7 g/dL — ABNORMAL LOW (ref 6.5–8.1)

## 2017-10-31 LAB — BASIC METABOLIC PANEL
Anion gap: 15 (ref 5–15)
BUN: 24 mg/dL — ABNORMAL HIGH (ref 6–20)
CO2: 26 mmol/L (ref 22–32)
Calcium: 8.7 mg/dL — ABNORMAL LOW (ref 8.9–10.3)
Chloride: 96 mmol/L — ABNORMAL LOW (ref 101–111)
Creatinine, Ser: 0.92 mg/dL (ref 0.61–1.24)
GFR calc Af Amer: >60 mL/min (ref >60)
GFR calc non Af Amer: >60 mL/min (ref >60)
Glucose, Bld: 132 mg/dL — ABNORMAL HIGH (ref 65–99)
Potassium: 4.2 mmol/L (ref 3.5–5.1)
Sodium: 137 mmol/L (ref 135–145)

## 2017-10-31 LAB — CBC WITH DIFFERENTIAL/PLATELET
Basophils Absolute: 0 10*3/uL (ref 0.0–0.1)
Basophils Relative: 0 %
Eosinophils Absolute: 0 10*3/uL (ref 0.0–0.7)
Eosinophils Relative: 0 %
HCT: 26.8 % — ABNORMAL LOW (ref 39.0–52.0)
Hemoglobin: 8.9 g/dL — ABNORMAL LOW (ref 13.0–17.0)
Lymphocytes Relative: 8 %
Lymphs Abs: 1.5 10*3/uL (ref 0.7–4.0)
MCH: 30.2 pg (ref 26.0–34.0)
MCHC: 33.2 g/dL (ref 30.0–36.0)
MCV: 90.8 fL (ref 78.0–100.0)
Monocytes Absolute: 2 10*3/uL — ABNORMAL HIGH (ref 0.1–1.0)
Monocytes Relative: 11 %
Neutro Abs: 15 10*3/uL — ABNORMAL HIGH (ref 1.7–7.7)
Neutrophils Relative %: 81 %
Platelets: 400 10*3/uL (ref 150–400)
RBC: 2.95 MIL/uL — ABNORMAL LOW (ref 4.22–5.81)
RDW: 13.7 % (ref 11.5–15.5)
WBC: 18.5 10*3/uL — ABNORMAL HIGH (ref 4.0–10.5)

## 2017-10-31 LAB — COOXEMETRY PANEL
CARBOXYHEMOGLOBIN: 0.8 % (ref 0.5–1.5)
Methemoglobin: 1.4 % (ref 0.0–1.5)
O2 SAT: 46.8 %
Total hemoglobin: 12.1 g/dL (ref 12.0–16.0)

## 2017-10-31 LAB — CULTURE, RESPIRATORY

## 2017-10-31 LAB — CULTURE, RESPIRATORY W GRAM STAIN

## 2017-10-31 MED ORDER — METFORMIN HCL 500 MG PO TABS
500.0000 mg | ORAL_TABLET | Freq: Two times a day (BID) | ORAL | Status: DC
  Administered 2017-10-31 – 2017-11-03 (×7): 500 mg via ORAL
  Filled 2017-10-31 (×7): qty 1

## 2017-10-31 MED ORDER — LEVALBUTEROL HCL 0.63 MG/3ML IN NEBU
0.6300 mg | INHALATION_SOLUTION | Freq: Three times a day (TID) | RESPIRATORY_TRACT | Status: DC
Start: 2017-11-01 — End: 2017-11-01
  Administered 2017-11-01: 0.63 mg via RESPIRATORY_TRACT
  Filled 2017-10-31: qty 3

## 2017-10-31 MED ORDER — ATORVASTATIN CALCIUM 40 MG PO TABS
40.0000 mg | ORAL_TABLET | Freq: Every day | ORAL | Status: DC
  Administered 2017-10-31 – 2017-11-02 (×3): 40 mg via ORAL
  Filled 2017-10-31 (×3): qty 1

## 2017-10-31 NOTE — Progress Notes (Signed)
PULMONARY / CRITICAL CARE MEDICINE   Name: Eddie Black MRN: [id number] DOB: 09-17-1960    ADMISSION DATE:  10/25/2017 CONSULTATION DATE: 10/28/2017  REFERRING MD:  Donata ClayVan Trigt CHIEF COMPLAINT: shortness of breath   HISTORY OF PRESENT ILLNESS:   Eddie Black is a 57 yr old who was admitted post vfib arrest on 10/25/2017 and was found to have multivessel disease and was taken for emergent CABG X3. Was extubated post operatively but started desaturating today from 4 litres oxygen to NRBFM and BIPAP 100% FIO2 on mobilization. I was called for stat consult to assess the patient. Patient is on BIPAP sitting on the chair in moderate distress but feeling somewhat better. Denies chest pain, fever. Wife at bedside.   He is known with DM, HTN Hyperlipidemia. He is also a smoker of more than 7683yrs.    Interval Hx: On highflow nasal canula 8 litres Feeling better overall sitting on the chair  Diuresing well  After reviwing echo again there is no PFO or right to left shunt.    PAST MEDICAL HISTORY :  He  has a past medical history of Diabetes mellitus (HCC), Hyperlipemia, Hypertension, and Tobacco abuse.  PAST SURGICAL HISTORY: He  has a past surgical history that includes LEFT HEART CATH AND CORONARY ANGIOGRAPHY (N/A, 10/25/2017); IABP Insertion (N/A, 10/25/2017); Coronary artery bypass graft (N/A, 10/25/2017); and TEE without cardioversion (N/A, 10/25/2017).  No Known Allergies  No current facility-administered medications on file prior to encounter.    Current Outpatient Medications on File Prior to Encounter  Medication Sig  . atorvastatin (LIPITOR) 40 MG tablet Take 40 mg by mouth at bedtime.  . enalapril (VASOTEC) 20 MG tablet Take 20 mg by mouth at bedtime.  Marland Kitchen. ibuprofen (ADVIL,MOTRIN) 800 MG tablet Take 800 mg by mouth 3 (three) times daily as needed (pain).   Marland Kitchen. levothyroxine (SYNTHROID, LEVOTHROID) 25 MCG tablet Take 25 mcg by mouth daily.  . metFORMIN (GLUCOPHAGE) 500 MG tablet Take 500 mg  by mouth 2 (two) times daily with a meal.  . Omega-3 Fatty Acids (FISH OIL TRIPLE STRENGTH PO) Take 1 capsule by mouth 2 (two) times daily.    FAMILY HISTORY:  His has no family status information on file.    SOCIAL HISTORY: He  reports that he has been smoking.  He does not have any smokeless tobacco history on file. He reports that he drinks alcohol. He reports that he has current or past drug history. Drug: Marijuana.  REVIEW OF SYSTEMS:   All 11 point system review were unremarkable other than what is mentioned in HPI    VITAL SIGNS: BP (!) 161/90   Pulse 81   Temp 98.3 F (36.8 C) (Oral)   Resp (!) 23   Ht 6\' 1"  (1.854 m)   Wt 100.4 kg (221 lb 5.5 oz)   SpO2 95%   BMI 29.20 kg/m   HEMODYNAMICS:    VENTILATOR SETTINGS:    INTAKE / OUTPUT: I/O last 3 completed shifts: In: 592.2 [I.V.:292.2; IV Piggyback:300] Out: 4550 [Urine:4460; Chest Tube:90]  PHYSICAL EXAMINATION: General:  On highflow does not look in distress Neuro:  Alert oriented moving all extremities  HEENT:  Moist  Cardiovascular:  Normal heart sounds no murmurs  Lungs:  Bilateral basal coarse crackles  Abdomen: soft no tenderness no megally  Musculoskeletal:  No  edema  Skin:  No rash   LABS:  BMET Recent Labs  Lab 10/29/17 0851 10/30/17 0500 10/31/17 0510  NA 136 135 135  K  3.7 3.4* 4.5  CL 97* 95* 98*  CO2 27 28 26   BUN 25* 25* 23*  CREATININE 0.88 0.87 0.86  GLUCOSE 159* 121* 147*    Electrolytes Recent Labs  Lab 10/26/17 0431 10/26/17 1538  10/29/17 0851 10/30/17 0500 10/31/17 0510  CALCIUM 7.8*  --    < > 8.3* 8.2* 8.6*  MG 2.4 1.9  --   --   --   --    < > = values in this interval not displayed.    CBC Recent Labs  Lab 10/29/17 0418 10/29/17 0851 10/30/17 0500  WBC 15.1* 18.3* 12.7*  HGB 8.7* 8.8* 8.1*  HCT 26.9* 26.0* 24.1*  PLT 217 256 266    Coag's Recent Labs  Lab 10/25/17 1529 10/25/17 2254 10/27/17 0351  APTT >200* 27 53*  INR 1.27 1.40 1.38     Sepsis Markers No results for input(s): LATICACIDVEN, PROCALCITON, O2SATVEN in the last 168 hours.  ABG Recent Labs  Lab 10/28/17 1232 10/28/17 1328 10/28/17 1730  PHART 7.428 7.438 7.459*  PCO2ART 39.1 38.7 39.3  PO2ART 45.0* 52.0* 58.0*    Liver Enzymes Recent Labs  Lab 10/29/17 0418 10/30/17 0500 10/31/17 0510  AST 21 19 21   ALT 39 33 34  ALKPHOS 53 49 56  BILITOT 0.6 0.8 1.0  ALBUMIN 2.8* 2.7* 2.9*    Cardiac Enzymes Recent Labs  Lab 10/25/17 1529  TROPONINI 0.10*    Glucose Recent Labs  Lab 10/30/17 1123 10/30/17 1608 10/30/17 1916 10/30/17 2340 10/31/17 0336 10/31/17 0726  GLUCAP 134* 171* 184* 161* 134* 140*    Imaging Dg Chest Port 1 View  Result Date: 10/31/2017 CLINICAL DATA:  Pleural effusion CABG EXAM: PORTABLE CHEST 1 VIEW COMPARISON:  10/30/2017 FINDINGS: Left chest tube removed. No pneumothorax. Right arm PICC tip at the cavoatrial junction unchanged Improved lung volume. Bibasilar atelectasis remains. No significant pleural effusion IMPRESSION: Left chest tube removed.  No pneumothorax Bibasilar atelectasis with improved lung volume. Electronically Signed   By: Marlan Palau M.D.   On: 10/31/2017 08:51     STUDIES:  Echo 10/25/2017 EF50-55%     ANTIBIOTICS: Cefazolin Start zosyn 4/1   DISCUSSION: 57 yr old male with post vfib arrest s/p CABG x3 on 3/29, DM, HTN tobacco abuser now with acute hypoxemic respiratory failure secondary to COPD exacerbation and acute pulmonary edema and acute diastolic heart failure. Patient does have some infiltrates suggestive of aspiration pneumonia   ASSESSMENT / PLAN:  PULMONARY A: acute hypoxemic resp failure requiring highflow nasal canula secondary to COPD exacerbation and pulm edema with suspected PNA COPD exacerbation Aspiration pneumonia Acute pulm edema   P:   - continue IV solumedrol  - on scheduled nebs  - continue iv lasix q12hrs keep in negative balance   - incentive  spirometry  - ABx    CARDIOVASCULAR A:  V fib arrest CAD  S/p CABGX3 3/29    P:  -  After reviewing his echo again there is no PFO and no right to left shunt - diurese with IV lasix q12hrs  - as per thoracic Sx  RENAL A:   Hypokalemia   P:   Replace electrolytes as needed   GASTROINTESTINAL A:    P:      HEMATOLOGIC A:   Leukocytosis possible related to pneumonia improving  P:  Trending down   INFECTIOUS A:   Asp PNA   P:   Continue  zosyn  Sputum culture follow up   ENDOCRINE  A:   DM    P:   Sliding scale   NEUROLOGIC A:   Intact    FAMILY  - Updates: no family available discussed with patient   - Inter-disciplinary family meet or Palliative Care meeting due by: 4/8   Pulmonary and Critical Care Medicine [facility] Pager: [phone]  10/31/2017, 9:51 AM

## 2017-10-31 NOTE — Discharge Instructions (Signed)

## 2017-10-31 NOTE — Progress Notes (Signed)
[facility] BRIEF SICU PROGRESS NOTE  6 Days Post-Op  S/P Procedure(s) (LRB): CORONARY ARTERY BYPASS GRAFTING (CABG) times three, WITH ENDOSCOPIC HARVESTING OF RIGHT SAPHENOUS VEIN (N/A) TRANSESOPHAGEAL ECHOCARDIOGRAM (TEE) (N/A)   Stable day NSR w/ stable BP Breathing comfortably w/ O2 sats 97% on 2 L/min UOP adequate  Plan: Continue current plan  Purcell Nailslarence H Owen, MD 10/31/2017 7:38 PM

## 2017-10-31 NOTE — Progress Notes (Addendum)
      [address]       [place] [id number]             [phone]        6 Days Post-Op Procedure(s) (LRB): CORONARY ARTERY BYPASS GRAFTING (CABG) times three, WITH ENDOSCOPIC HARVESTING OF RIGHT SAPHENOUS VEIN (N/A) TRANSESOPHAGEAL ECHOCARDIOGRAM (TEE) (N/A)  Subjective: Patient on high flow [place], sitting in chair. He states his breathing is "so so".  Objective: Vital signs in last 24 hours: Temp:  [98.1 F (36.7 C)-98.5 F (36.9 C)] 98.3 F (36.8 C) (04/04 0730) Pulse Rate:  [68-87] 72 (04/04 0600) Cardiac Rhythm: Normal sinus rhythm (04/03 2000) Resp:  [13-26] 13 (04/04 0600) BP: (120-164)/(59-88) 131/73 (04/04 0600) SpO2:  [88 %-99 %] 98 % (04/04 0600) Weight:  [221 lb 5.5 oz (100.4 kg)] 221 lb 5.5 oz (100.4 kg) (04/04 0600)  Pre op weight 110.4 kg Current Weight  10/31/17 221 lb 5.5 oz (100.4 kg)      Intake/Output from previous day: 04/03 0701 - 04/04 0700 In: 384.3 [I.V.:184.3; IV Piggyback:200] Out: 3435 [Urine:3435]   Physical Exam:  Cardiovascular: RRR Pulmonary: On high flow [place], slightly diminished at bases Abdomen: Soft, non tender, sporadic bowel sounds present. Extremities: Trace bilateral lower extremity edema. Wounds: Sternal wound is clean and dry. RLE wound is clean and dry.     Lab Results: CBC: Recent Labs    10/29/17 0851 10/30/17 0500  WBC 18.3* 12.7*  HGB 8.8* 8.1*  HCT 26.0* 24.1*  PLT 256 266   BMET:  Recent Labs    10/30/17 0500 10/31/17 0510  NA 135 135  K 3.4* 4.5  CL 95* 98*  CO2 28 26  GLUCOSE 121* 147*  BUN 25* 23*  CREATININE 0.87 0.86  CALCIUM 8.2* 8.6*    PT/INR:  Lab Results  Component Value Date   INR 1.38 10/27/2017   INR 1.40 10/25/2017   INR 1.27 10/25/2017   ABG:  INR: Will add last result for INR, ABG once components are confirmed Will add last 4 CBG results once components are confirmed  Assessment/Plan:  1. CV - S/p inferior MI, V fib arrest, cardiogenic shock. On  Lopressor 12.5 mg bid and Milrinone drip. Co ox this am slightly decreased to 46.8 despite (Milrinone decreased yesterday). Of note, echo 2 days ago showed large PFO. Dr. Rennis GoldenHilty and Dr. Anne FuSkains reviewed this echo and it was of poor quality. Bubble study done yesterday showed NO (left to right shunt) . 2.  Pulmonary - On high flow oxygen-7 liters of oxygen. On Solumedrol. CXR this am appears stable (bibasilar atelectasis, interstitial edema). Encourage incentive spirometer. Appreciate pulmonary's assistance 3. Volume Overload - On Lasix 40 mg IV bid.  4.  Acute blood loss anemia - Await this am's H and H. Last H and H slightly decreased to 8.1 and 24.1. Will start Trinsicon. 5. DM-CBGs 184/161/134. On Insulin  Will restart Metformin and stop scheduled Insulin. Pre op HGA1C 6.2.    Donielle M ZimmermanPA-C 10/31/2017,7:34 AM  Patient recovering from acute respiratory insufficiency from pre-existing pulmonary damage from smoking and pulmonary edema following MI. He needs to ambulate and have his oxygen requirement less than 4 L/min before he is ready to transfer to stepdown.  Following his postoperative anemia and administering oral iron  Pain is well controlled and incisions are healing.  Plan-continue pulmonary care ambulation diuresis and keep in ICU until oxygen saturations improve  Lovett SoxPeter Vantrigt MD

## 2017-10-31 NOTE — Progress Notes (Signed)
DAILY PROGRESS NOTE   Patient Name: Eddie Black Date of Encounter: 10/31/2017  Chief Complaint   Breathing is improving  Patient Profile   Eddie Black is a 57 y.o. male with a history of newly diagnosed DM, HTN, HLD and h/o tobacco abuse, presenting to the [facility] ED via EMS for out of hospital Vfib arrest. ROSC achieved with CPR + defibrillation. Post defibrillation EKG showed atrial fibrillation with widespread ST elevations.    Subjective   Diuresed another 3L negative - now 8.6L negative. Creatinine is stable. CXR still shows persistent interstitial markings. Co-ox slightly lower today on lower dose milrinone. Limited echo yesterday with bubble study shows no right to left shunting.  Objective   Vitals:   10/31/17 0400 10/31/17 0500 10/31/17 0600 10/31/17 0730  BP: 132/70 (!) 141/60 131/73   Pulse: 68 71 72   Resp: 16 17 13    Temp:    98.3 F (36.8 C)  TempSrc:    Oral  SpO2: 99% 92% 98%   Weight:   221 lb 5.5 oz (100.4 kg)   Height:        Intake/Output Summary (Last 24 hours) at 10/31/2017 0806 Last data filed at 10/31/2017 0600 Gross per 24 hour  Intake 376.03 ml  Output 3435 ml  Net -3058.97 ml   Filed Weights   10/29/17 0500 10/30/17 0600 10/31/17 0600  Weight: 238 lb 12.1 oz (108.3 kg) 229 lb 15 oz (104.3 kg) 221 lb 5.5 oz (100.4 kg)    Physical Exam   General appearance: alert, no distress and morbidly obese Neck: no carotid bruit, no JVD and thyroid not enlarged, symmetric, no tenderness/mass/nodules Lungs: diminished breath sounds bilaterally and rales bibasilar Heart: regular rate and rhythm Abdomen: soft, non-tender; bowel sounds normal; no masses,  no organomegaly and obese Extremities: edema 1+RLE, trace LLE Pulses: 2+ and symmetric Skin: Skin color, texture, turgor normal. No rashes or lesions Neurologic: Grossly normal Psych: Pleasant  Inpatient Medications    Scheduled Meds: . aspirin EC  325 mg Oral Daily   Or  . aspirin  324 mg Per  Tube Daily  . bisacodyl  10 mg Oral Daily   Or  . bisacodyl  10 mg Rectal Daily  . Chlorhexidine Gluconate Cloth  6 each Topical Daily  . docusate sodium  200 mg Oral Daily  . enoxaparin (LOVENOX) injection  40 mg Subcutaneous Q24H  . feeding supplement (GLUCERNA SHAKE)  237 mL Oral TID BM  . ferrous [id number] C-folic acid  1 capsule Oral BID  . furosemide  40 mg Intravenous BID  . guaiFENesin  600 mg Oral BID  . levalbuterol  0.63 mg Nebulization Q6H  . mouth rinse  15 mL Mouth Rinse BID  . metFORMIN  500 mg Oral BID WC  . methylPREDNISolone (SOLU-MEDROL) injection  40 mg Intravenous Q6H  . metoprolol tartrate  12.5 mg Oral BID   Or  . metoprolol tartrate  12.5 mg Per Tube BID  . pantoprazole  40 mg Oral Daily    Continuous Infusions: . sodium chloride    . sodium chloride 10 mL/hr at 10/27/17 1630  . sodium chloride    . lactated ringers 10 mL/hr at 10/27/17 1100  . piperacillin-tazobactam (ZOSYN)  IV Stopped (10/31/17 0542)    PRN Meds: Place/Maintain arterial line **AND** sodium chloride, metoprolol tartrate, ondansetron (ZOFRAN) IV, oxyCODONE, pneumococcal 23 valent vaccine, sodium chloride flush, traMADol   Labs   Results for orders placed or performed during the hospital encounter  of 10/25/17 (from the past 48 hour(s))  CBC with Differential/Platelet     Status: Abnormal   Collection Time: 10/29/17  8:51 AM  Result Value Ref Range   WBC 18.3 (H) 4.0 - 10.5 K/uL   RBC 2.86 (L) 4.22 - 5.81 MIL/uL   Hemoglobin 8.8 (L) 13.0 - 17.0 g/dL   HCT 26.0 (L) 39.0 - 52.0 %   MCV 90.9 78.0 - 100.0 fL   MCH 30.8 26.0 - 34.0 pg   MCHC 33.8 30.0 - 36.0 g/dL   RDW 13.5 11.5 - 15.5 %   Platelets 256 150 - 400 K/uL   Neutrophils Relative % 81 %   Neutro Abs 15.0 (H) 1.7 - 7.7 K/uL   Lymphocytes Relative 7 %   Lymphs Abs 1.2 0.7 - 4.0 K/uL   Monocytes Relative 12 %   Monocytes Absolute 2.1 (H) 0.1 - 1.0 K/uL   Eosinophils Relative 0 %   Eosinophils Absolute 0.0 0.0  - 0.7 K/uL   Basophils Relative 0 %   Basophils Absolute 0.0 0.0 - 0.1 K/uL    Comment: Performed at [facility], [address], [place], [place] [address]  Basic metabolic panel     Status: Abnormal   Collection Time: 10/29/17  8:51 AM  Result Value Ref Range   Sodium 136 135 - 145 mmol/L   Potassium 3.7 3.5 - 5.1 mmol/L   Chloride 97 (L) 101 - 111 mmol/L   CO2 27 22 - 32 mmol/L   Glucose, Bld 159 (H) 65 - 99 mg/dL   BUN 25 (H) 6 - 20 mg/dL   Creatinine, Ser 0.88 0.61 - 1.24 mg/dL   Calcium 8.3 (L) 8.9 - 10.3 mg/dL   GFR calc non Af Amer >60 >60 mL/min   GFR calc Af Amer >60 >60 mL/min    Comment: (NOTE) The eGFR has been calculated using the CKD EPI equation. This calculation has not been validated in all clinical situations. eGFR's persistently <60 mL/min signify possible Chronic Kidney Disease.    Anion gap 12 5 - 15    Comment: Performed at [facility] [address], [place], [place] [address]  Glucose, capillary     Status: Abnormal   Collection Time: 10/29/17 11:14 AM  Result Value Ref Range   Glucose-Capillary 176 (H) 65 - 99 mg/dL   Comment 1 Notify RN   Glucose, capillary     Status: Abnormal   Collection Time: 10/29/17  4:23 PM  Result Value Ref Range   Glucose-Capillary 150 (H) 65 - 99 mg/dL   Comment 1 Notify RN   Glucose, capillary     Status: Abnormal   Collection Time: 10/29/17  7:17 PM  Result Value Ref Range   Glucose-Capillary 160 (H) 65 - 99 mg/dL   Comment 1 Notify RN   Glucose, capillary     Status: Abnormal   Collection Time: 10/29/17 11:37 PM  Result Value Ref Range   Glucose-Capillary 129 (H) 65 - 99 mg/dL   Comment 1 Notify RN   Glucose, capillary     Status: None   Collection Time: 10/30/17  3:47 AM  Result Value Ref Range   Glucose-Capillary 96 65 - 99 mg/dL   Comment 1 Notify RN   CBC     Status: Abnormal   Collection Time: 10/30/17  5:00 AM  Result Value Ref Range   WBC 12.7 (H) 4.0 - 10.5 K/uL   RBC 2.62 (L) 4.22 -  5.81  MIL/uL   Hemoglobin 8.1 (L) 13.0 - 17.0 g/dL   HCT 24.1 (L) 39.0 - 52.0 %   MCV 92.0 78.0 - 100.0 fL   MCH 30.9 26.0 - 34.0 pg   MCHC 33.6 30.0 - 36.0 g/dL   RDW 13.8 11.5 - 15.5 %   Platelets 266 150 - 400 K/uL    Comment: Performed at [facility] [address] [place], [place] [address]  Comprehensive metabolic panel     Status: Abnormal   Collection Time: 10/30/17  5:00 AM  Result Value Ref Range   Sodium 135 135 - 145 mmol/L   Potassium 3.4 (L) 3.5 - 5.1 mmol/L   Chloride 95 (L) 101 - 111 mmol/L   CO2 28 22 - 32 mmol/L   Glucose, Bld 121 (H) 65 - 99 mg/dL   BUN 25 (H) 6 - 20 mg/dL   Creatinine, Ser 0.87 0.61 - 1.24 mg/dL   Calcium 8.2 (L) 8.9 - 10.3 mg/dL   Total Protein 5.1 (L) 6.5 - 8.1 g/dL   Albumin 2.7 (L) 3.5 - 5.0 g/dL   AST 19 15 - 41 U/L   ALT 33 17 - 63 U/L   Alkaline Phosphatase 49 38 - 126 U/L   Total Bilirubin 0.8 0.3 - 1.2 mg/dL   GFR calc non Af Amer >60 >60 mL/min   GFR calc Af Amer >60 >60 mL/min    Comment: (NOTE) The eGFR has been calculated using the CKD EPI equation. This calculation has not been validated in all clinical situations. eGFR's persistently <60 mL/min signify possible Chronic Kidney Disease.    Anion gap 12 5 - 15    Comment: Performed at [facility] [address] [place], [place] [address]  .Cooxemetry Panel (carboxy, met, total hgb, O2 sat)     Status: Abnormal   Collection Time: 10/30/17  5:10 AM  Result Value Ref Range   Total hemoglobin 7.9 (L) 12.0 - 16.0 g/dL   O2 Saturation 51.1 %   Carboxyhemoglobin 0.9 0.5 - 1.5 %   Methemoglobin 1.7 (H) 0.0 - 1.5 %  Glucose, capillary     Status: Abnormal   Collection Time: 10/30/17  7:27 AM  Result Value Ref Range   Glucose-Capillary 115 (H) 65 - 99 mg/dL   Comment 1 Notify RN   Glucose, capillary     Status: Abnormal   Collection Time: 10/30/17 11:23 AM  Result Value Ref Range   Glucose-Capillary 134 (H) 65 - 99 mg/dL   Comment 1 Notify RN   Glucose,  capillary     Status: Abnormal   Collection Time: 10/30/17  4:08 PM  Result Value Ref Range   Glucose-Capillary 171 (H) 65 - 99 mg/dL   Comment 1 Notify RN   Glucose, capillary     Status: Abnormal   Collection Time: 10/30/17  7:16 PM  Result Value Ref Range   Glucose-Capillary 184 (H) 65 - 99 mg/dL  Glucose, capillary     Status: Abnormal   Collection Time: 10/30/17 11:40 PM  Result Value Ref Range   Glucose-Capillary 161 (H) 65 - 99 mg/dL   Comment 1 Notify RN   Glucose, capillary     Status: Abnormal   Collection Time: 10/31/17  3:36 AM  Result Value Ref Range   Glucose-Capillary 134 (H) 65 - 99 mg/dL   Comment 1 Notify RN   Comprehensive metabolic panel     Status: Abnormal   Collection Time: 10/31/17  5:10  AM  Result Value Ref Range   Sodium 135 135 - 145 mmol/L   Potassium 4.5 3.5 - 5.1 mmol/L    Comment: DELTA CHECK NOTED   Chloride 98 (L) 101 - 111 mmol/L   CO2 26 22 - 32 mmol/L   Glucose, Bld 147 (H) 65 - 99 mg/dL   BUN 23 (H) 6 - 20 mg/dL   Creatinine, Ser 0.86 0.61 - 1.24 mg/dL   Calcium 8.6 (L) 8.9 - 10.3 mg/dL   Total Protein 5.7 (L) 6.5 - 8.1 g/dL   Albumin 2.9 (L) 3.5 - 5.0 g/dL   AST 21 15 - 41 U/L   ALT 34 17 - 63 U/L   Alkaline Phosphatase 56 38 - 126 U/L   Total Bilirubin 1.0 0.3 - 1.2 mg/dL   GFR calc non Af Amer >60 >60 mL/min   GFR calc Af Amer >60 >60 mL/min    Comment: (NOTE) The eGFR has been calculated using the CKD EPI equation. This calculation has not been validated in all clinical situations. eGFR's persistently <60 mL/min signify possible Chronic Kidney Disease.    Anion gap 11 5 - 15    Comment: Performed at [facility] [address] [place],  [address]  .Cooxemetry Panel (carboxy, met, total hgb, O2 sat)     Status: None   Collection Time: 10/31/17  5:25 AM  Result Value Ref Range   Total hemoglobin 12.1 12.0 - 16.0 g/dL   O2 Saturation 46.8 %   Carboxyhemoglobin 0.8 0.5 - 1.5 %   Methemoglobin 1.4 0.0 - 1.5 %     ECG   N/A  Telemetry   Sinus rhythm - Personally Reviewed  Radiology    Dg Chest Port 1 View  Result Date: 10/30/2017 CLINICAL DATA:  Coronary disease post CABG, diabetes mellitus, hypertension, smoker EXAM: PORTABLE CHEST 1 VIEW COMPARISON:  Portable exam 0534 hours compared to 10/29/2017 FINDINGS: RIGHT arm PICC line tip projects over cavoatrial junction. LEFT thoracostomy tube. Upper normal size of cardiac silhouette post CABG. Slight pulmonary vascular congestion. Increased perihilar markings likely reflects minimal edema. Generally low inspiratory lung volumes. No pleural effusion or pneumothorax. Bones unremarkable. IMPRESSION: Question minimal perihilar edema. Electronically Signed   By: Lavonia Dana M.D.   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  Coronary artery disease. Chronic obstructive pulmonary disease.  PMH:   Myocardial infarction.  Sudden death episode.  Risk factors:  Hypertension. Diabetes mellitus.  ------------------------------------------------------------------- Study Conclusions  - Impressions: Limited study. LV EF appears normal With definity   there is no right to left shunt seen on bubble study.  Impressions:  - Limited study. LV EF appears normal With definity there is no   right to left shunt seen on bubble study.  Assessment   Principal Problem:   Cardiac arrest [facility]) Active Problems:   Diabetes mellitus type II, controlled ([facility])   Hyperlipidemia LDL goal <70   Essential hypertension   Tobacco abuse   CAD (coronary artery disease), native coronary artery   Ventricular fibrillation (HCC)   MI, acute, non ST segment elevation (HCC)   S/P CABG x 3   Acute hypoxemic respiratory failure (HCC)   COPD exacerbation (HCC)   Aspiration pneumonia due to gastric secretions (HCC)   Hypokalemia   Plan   1. Improved oxygenation - almost 8L negative diuresis. DDx for hypoxemia is  broad, however, I feel confident  we have excluded significant right to left shunt. There is could be a component of V/Q mismatch, alveolar hypoventilation or decreased gas exchange - CXR seems to support increased interstitial markings consistent with pulmonary edema. Would continue aggressive diuresis and wean oxygen as tolerated until there is evidence for rise in creatinine.   Time Spent Directly with Patient:  I have spent a total of 25 minutes with the patient reviewing hospital notes, telemetry, EKGs, labs and examining the patient as well as establishing an assessment and plan that was discussed personally with the patient. > 50% of time was spent in direct patient care.  Length of Stay:  LOS: 6 days   Pixie Casino, MD, [facility], [place] Director of the Advanced Lipid Disorders &  Cardiovascular Risk Reduction Clinic Diplomate of the American Board of Clinical Lipidology Attending Cardiologist  Direct Dial: [phone]  Fax: [phone]  Website:  [email or web address] 10/31/2017, 8:06 AM

## 2017-10-31 NOTE — Discharge Summary (Addendum)
Physician Discharge Summary       [address]       [place] [id number]             [phone]    Patient ID: Eddie Black MRN: [id number] DOB/AGE: [birth year] 57 y.o.  Admit date: 10/25/2017 Discharge date: 11/03/2017  Admission Diagnoses: 1. Ventricular fibrillation, out of hospital cardiac arrest (HCC) 2. MI, acute, non ST segment elevation (HCC) 3. CAD (coronary artery disease), native coronary artery 4. Cardiogenic shock  Discharge Diagnoses:  1. S/P CABG x 3 2. Acute respiratory failure 3. COPD exacerbation (HCC) 4. Acute pulmonary edema 5. ABL anemia 6. History of diabetes mellitus type II, controlled (HCC) 7. History of hyperlipidemia LDL goal <70 8. History of essential hypertension 9. History of tobacco abuse     Consults: cardiology and pulmonary/intensive care  Procedure (s):  ABP Insertion  LEFT HEART CATH AND CORONARY ANGIOGRAPHY by Dr. Katrinka BlazingSmith on 10/25/2017:  Conclusion    Heavy three-vessel coronary artery disease (question Monckeberg's Sclerosis) -from proximal to distal segments and and secondary branches.  Severe proximal RCA with thrombus superimposed on bulky calcification obstructing the artery up to 99%.  TIMI grade III flow was noted.  Large PDA has competitive flow from collaterals via LAD.  Mid and distal vessel contains 50-80% narrowing possibly with thrombus or bulky calcification.  Eccentric 70-80% proximal LAD, total occlusion of the first diagonal, and 50% mid LAD.  Relatively small circumflex with widely patent first obtuse marginal, total occlusion of the mid segment of the second obtuse marginal, and high-grade obstruction in the ostium of the third obtuse marginal.  Marginals are questionable for grafting.  Overall normal LV function although inferobasal segment cannot be well visualized.  LVEDP less than 10 mmHg.  Intra-aortic balloon pump was inserted without complications from the right femoral artery using  micropuncture technique and fluoroscopic guidance.  RECOMMENDATIONS:   Heart team approach with Dr. Kathlee NationsPeter Van Trigt.  Interventional concerns include bulky calcified right coronary disease and thrombus likely the cause distal embolization and infarction if PCI.  TIMI grade III flow is currently present.  With proximal LAD involvement and other native disease as noted above, surgery was felt to be the treatment of choice in this diabetic patient with multiple other risk factors including smoking.  Surgery will certainly add survival benefits and decrease the likelihood of recurrent ischemic event/repeat revascularization.    TRANSESOPHAGEAL ECHOCARDIOGRAM (TEE), EMERGENT MEDIAN STERNOTOMY for CORONARY ARTERY BYPASS GRAFTING (CABG)x 3 LIMA to LAD, SVG to DIAG 1, and SVG to PLB)  WITH ENDOSCOPIC HARVESTING OF RIGHT GREATER SAPHENOUS VEIN and LEFT INTERNAL MAMMARY ARTERY by Dr. Donata ClayVan Trigt on 10/25/2017.  History of Presenting Illness: This is a 57 year old obese diabetic smoker with hypertension brought to the ED after being resuscitated from V. fib arrest while in his car and developed loss of consciousness.  He received CPR and shocks by EMS.  He regained consciousness and had rapid atrial fibrillation with diffuse ST segment elevation.  Chest x-ray in the ED was unremarkable.  Emergency cardiac cath by Dr. Katrinka BlazingSmith shows three-vessel CAD with moderate LV dysfunction normal LVEDP.  Right coronary was large with a 99% obstruction with clot and calcium.  LAD had a 70% proximal stenosis in the circumflex had a total occlusion of a small marginal branch.  Intra-aortic balloon pump was placed for low blood pressure.  Emergency CABG was recommended by Dr. Katrinka BlazingSmith as best therapy for this situation and his coronary anatomy.  Dr.  Donata Clay agreed with this recommendation and discussed the situation with the patient and then his wife.  We will proceed with emergency multivessel CABG.  The patient and his wife  understands the risks of stroke, bleeding, blood transfusion, postoperative pulmonary problems including pleural effusion, postoperative infection, postoperative organ failure, and death.  They agreed to proceed with surgery. Pre operative carotid duplex showed no significant internal carotid artery stenosis bilaterally. He underwent CABG x 3 on 10/25/2017.  Brief Hospital Course:  The patient was extubated the evening of surgery without difficulty. He remained afebrile and hemodynamically stable. His IABP was removed on 03/31. He was weaned off Norepinephrine and gradually weaned off Milrinone drips. Co ox was monitored daily while on Milrinone. Eddie Black, and a line, were removed early in the post operative course. Chest tubes and foley remained for a few days more then were removed Lopressor was started.  He was volume over loaded and diuresed. On 04/01, he developed respiratory distress. ABG, chest x ray, Xopenex were ordered.  He was placed on bipap. A consult was obtained with pulmonary/CCM. He was started on Zosyn, IV Solumedrol, and given more nebs. It was felt this was likely related to possible aspiration pneumonia as well as acute pulmonary edema and COPD exacerbation. Patient was weaned to high flow oxygen. Over time, his oxygenation continued to improve and as of 04/05, he was on [place] at 2 liters. Duplex US of the lower extremities was negative for DVT.Echo done 04/02 showed LVEF 65-70% and probable large PFO. Cardiology was then consulted. A bubble study was done on 04/03. This showed no right to left shunt. Dr. Rennis Golden and Dr. Anne Fu reviewed both echocardiograms. It was felt the patient did NOT have a PFO (Per Dr. Rennis Golden, he suspected the poor quality of the images lead to misinterpretation of low venous flow in the IVC that was called a PFO). He had ABL anemia. He did not require a post op transfusion. Last H and H was 9.4 and 28.4. He is on Trinsicon. He was weaned off the insulin drip.  Once he was  tolerating a diet,Metformin was restarted on 04/04.  The patient's glucose remained well controlled on Metformin.The patient's HGA1C pre op was 6.2. The patient was felt surgically stable for transfer from the ICU to PCTU for further convalescence on 11/01/2017. He continues to progress with cardiac rehab.  He has been ambulating on room air. He has been tolerating a diet and has had a bowel movement. Epicardial pacing wires were removed on 11/01/2017. Chest tube sutures will be removed the day of discharge. Patient was instructed to weigh himself every morning and record weight. If his lower legs get more swollen or weight increases a few pounds in 1-2 days, he was instructed to call cardiology or our office as may need to adjust his diuretic. The patient is felt surgically stable for discharge today.  Latest Vital Signs: Blood pressure 134/72, pulse 73, temperature 98.2 F (36.8 C), temperature source Oral, resp. rate 18, height 6\' 1"  (1.854 m), weight 209 lb 6.4 oz (95 kg), SpO2 94 %.  Physical Exam: Cardiovascular: RRR Pulmonary: Clear to auscultation bilaterally Abdomen: Soft, non tender, bowel sounds present. Extremities: No lower extremity edema. Wounds: Clean and dry.  No erythema or signs of infection.   Discharge Condition: Stable and discharged to home.  Recent laboratory studies:  Lab Results  Component Value Date   WBC 20.3 (H) 11/02/2017   HGB 9.4 (L) 11/02/2017   HCT 28.4 (  L) 11/02/2017   MCV 91.9 11/02/2017   PLT 542 (H) 11/02/2017   Lab Results  Component Value Date   NA 135 11/02/2017   K 3.8 11/02/2017   CL 98 (L) 11/02/2017   CO2 26 11/02/2017   CREATININE 0.87 11/02/2017   GLUCOSE 106 (H) 11/02/2017     Diagnostic Studies: Dg Chest Port 1 View  Result Date: 10/31/2017 CLINICAL DATA:  Pleural effusion CABG EXAM: PORTABLE CHEST 1 VIEW COMPARISON:  10/30/2017 FINDINGS: Left chest tube removed. No pneumothorax. Right arm PICC tip at the cavoatrial junction  unchanged Improved lung volume. Bibasilar atelectasis remains. No significant pleural effusion IMPRESSION: Left chest tube removed.  No pneumothorax Bibasilar atelectasis with improved lung volume. Electronically Signed   By: Marlan Palau M.D.   On: 10/31/2017 08:51    Discharge Instructions    Amb Referral to Cardiac Rehabilitation   Complete by:  As directed    Referral for CRP II at [facility]   Diagnosis:   CABG NSTEMI     CABG X ___:  3     Discharge Medications: Allergies as of 11/03/2017   No Known Allergies     Medication List    STOP taking these medications   ibuprofen 800 MG tablet Commonly known as:  ADVIL,MOTRIN     TAKE these medications   aspirin 325 MG EC tablet Take 1 tablet (325 mg total) by mouth daily.   atorvastatin 40 MG tablet Commonly known as:  LIPITOR Take 40 mg by mouth at bedtime.   enalapril 2.5 MG tablet Commonly known as:  VASOTEC Take 1 tablet (2.5 mg total) by mouth at bedtime. What changed:    medication strength  how much to take   ferrous sulfate 325 (65 FE) MG tablet Take 1 tablet (325 mg total) by mouth daily with breakfast. For one month then stop.   FISH OIL TRIPLE STRENGTH PO Take 1 capsule by mouth 2 (two) times daily.   furosemide 40 MG tablet Commonly known as:  LASIX Take 1 tablet (40 mg total) by mouth daily.   levothyroxine 25 MCG tablet Commonly known as:  SYNTHROID, LEVOTHROID Take 25 mcg by mouth daily.   metFORMIN 500 MG tablet Commonly known as:  GLUCOPHAGE Take 500 mg by mouth 2 (two) times daily with a meal.   metoprolol tartrate 25 MG tablet Commonly known as:  LOPRESSOR Take 0.5 tablets (12.5 mg total) by mouth 2 (two) times daily.   oxyCODONE 5 MG immediate release tablet Commonly known as:  Oxy IR/ROXICODONE Take 5 mg by mouth every 4-6 hours PRN severe pain   potassium chloride SA 20 MEQ tablet Commonly known as:  K-DUR,KLOR-CON Take 1 tablet (20 mEq total) by mouth daily.      The  patient has been discharged on:   1.Beta Blocker:  Yes [ x  ]                              No   [   ]                              If No, reason:  2.Ace Inhibitor/ARB: Yes [  x ]  No  [    ]                                     If No, reason:  3.Statin:   Yes [ x  ]                  No  [   ]                  If No, reason:  4.Ecasa:  Yes  [ x  ]                  No   [   ]                  If No, reason:  Follow Up Appointments: Follow-up Information    Donata Clay, Theron Arista, MD. Go on 12/04/2017.   Specialty:  Cardiothoracic Surgery Why:  PA/LAT CXR to be taken (at [facility] which is in the same building as Dr. Zenaida Niece Trigt's office) on 12/04/2017 at 11:30 am;Appointment time is at 12:00 pm Contact information: [address] [employer] [address] [place] [address] [phone]        Rosalio Macadamia, NP. Go on 11/14/2017.   Specialties:  Nurse Practitioner, Interventional Cardiology, Cardiology, Radiology Why:  Appointment time is at 11:00 am  Contact information: [address] [place] [address] [phone]           Signed: Lelon Huh [facility] 11/03/2017, 9:46 AM   patient examined and medical record reviewed,agree with above note. Kathlee Nations Trigt III 11/05/2017

## 2017-11-01 ENCOUNTER — Inpatient Hospital Stay (HOSPITAL_COMMUNITY): Payer: BLUE CROSS/BLUE SHIELD

## 2017-11-01 ENCOUNTER — Encounter (HOSPITAL_COMMUNITY): Payer: Self-pay

## 2017-11-01 DIAGNOSIS — J690 Pneumonitis due to inhalation of food and vomit: Secondary | ICD-10-CM

## 2017-11-01 DIAGNOSIS — J441 Chronic obstructive pulmonary disease with (acute) exacerbation: Secondary | ICD-10-CM

## 2017-11-01 DIAGNOSIS — J9601 Acute respiratory failure with hypoxia: Secondary | ICD-10-CM

## 2017-11-01 DIAGNOSIS — I469 Cardiac arrest, cause unspecified: Secondary | ICD-10-CM

## 2017-11-01 LAB — COMPREHENSIVE METABOLIC PANEL
ALT: 40 U/L (ref 17–63)
AST: 25 U/L (ref 15–41)
Albumin: 2.8 g/dL — ABNORMAL LOW (ref 3.5–5.0)
Alkaline Phosphatase: 55 U/L (ref 38–126)
Anion gap: 13 (ref 5–15)
BUN: 27 mg/dL — ABNORMAL HIGH (ref 6–20)
CO2: 25 mmol/L (ref 22–32)
Calcium: 8.4 mg/dL — ABNORMAL LOW (ref 8.9–10.3)
Chloride: 96 mmol/L — ABNORMAL LOW (ref 101–111)
Creatinine, Ser: 0.93 mg/dL (ref 0.61–1.24)
GFR calc Af Amer: >60 mL/min (ref >60)
GFR calc non Af Amer: >60 mL/min (ref >60)
Glucose, Bld: 148 mg/dL — ABNORMAL HIGH (ref 65–99)
Potassium: 4.4 mmol/L (ref 3.5–5.1)
Sodium: 134 mmol/L — ABNORMAL LOW (ref 135–145)
Total Bilirubin: 0.8 mg/dL (ref 0.3–1.2)
Total Protein: 6.3 g/dL — ABNORMAL LOW (ref 6.5–8.1)

## 2017-11-01 LAB — GLUCOSE, CAPILLARY
Glucose-Capillary: 153 mg/dL — ABNORMAL HIGH (ref 65–99)
Glucose-Capillary: 169 mg/dL — ABNORMAL HIGH (ref 65–99)
Glucose-Capillary: 135 mg/dL — ABNORMAL HIGH (ref 65–99)
Glucose-Capillary: 146 mg/dL — ABNORMAL HIGH (ref 65–99)
Glucose-Capillary: 219 mg/dL — ABNORMAL HIGH (ref 65–99)
Glucose-Capillary: 103 mg/dL — ABNORMAL HIGH (ref 65–99)

## 2017-11-01 LAB — CBC
HCT: 25.8 % — ABNORMAL LOW (ref 39.0–52.0)
Hemoglobin: 8.3 g/dL — ABNORMAL LOW (ref 13.0–17.0)
MCH: 29.3 pg (ref 26.0–34.0)
MCHC: 32.2 g/dL (ref 30.0–36.0)
MCV: 91.2 fL (ref 78.0–100.0)
Platelets: 441 10*3/uL — ABNORMAL HIGH (ref 150–400)
RBC: 2.83 MIL/uL — ABNORMAL LOW (ref 4.22–5.81)
RDW: 13.5 % (ref 11.5–15.5)
WBC: 20.9 10*3/uL — ABNORMAL HIGH (ref 4.0–10.5)

## 2017-11-01 LAB — EXPECTORATED SPUTUM ASSESSMENT W GRAM STAIN, RFLX TO RESP C

## 2017-11-01 LAB — EXPECTORATED SPUTUM ASSESSMENT W REFEX TO RESP CULTURE

## 2017-11-01 MED ORDER — PREDNISONE 20 MG PO TABS
20.0000 mg | ORAL_TABLET | Freq: Every day | ORAL | Status: AC
  Administered 2017-11-01 – 2017-11-02 (×2): 20 mg via ORAL
  Filled 2017-11-01 (×2): qty 1

## 2017-11-01 MED ORDER — LEVALBUTEROL HCL 0.63 MG/3ML IN NEBU: 0.6300 mg | INHALATION_SOLUTION | Freq: Four times a day (QID) | RESPIRATORY_TRACT | Status: DC | PRN

## 2017-11-01 NOTE — Evaluation (Addendum)
Physical Therapy Evaluation & Discharge Patient Details Name: Eddie Black Heeter MRN: [id number] DOB: 06/25/1961 Today's Date: 11/01/2017   History of Present Illness  Pt is a 57 y.o. male admitted post vfib arrest on 10/25/17; found to have multivessel disease and taken for emergent CABGx3. Extub post-op, but desaturated 4/1 requiring NRB and BiPAP 100% FiO2. PMH includes tobacco use, DM, HTN, COPD.    Clinical Impression  Patient evaluated by Physical Therapy with no further acute PT needs identified. PTA, pt indep and works full-time; will have 24/7 support from family at home. Today, amb 600' with no DME and supervision for safety. Educ on and reviewed sternal precautions (handout provided). All education has been completed and the patient has no further questions. Encouraged to continue ambulating with nursing/family/cardiac rehab. PT is signing off. Thank you for this referral.    Follow Up Recommendations No PT follow up;Supervision - Intermittent    Equipment Recommendations  None recommended by PT    Recommendations for Other Services       Precautions / Restrictions Precautions Precautions: Sternal Precaution Booklet Issued: Yes (comment) Precaution Comments: Verbally reivewed precautions Restrictions Weight Bearing Restrictions: Yes(sternal precautions) Other Position/Activity Restrictions: Sternal      Mobility  Bed Mobility Overal bed mobility: Independent             General bed mobility comments: Educ on log roll technique for return to supine  Transfers Overall transfer level: Independent Equipment used: None             General transfer comment: Good ability to maintain sternal precautions standing with hands on knees  Ambulation/Gait Ambulation/Gait assistance: Supervision Ambulation Distance (Feet): 600 Feet Assistive device: None Gait Pattern/deviations: Step-through pattern;Decreased stride length Gait velocity: Decreased Gait velocity  interpretation: Below normal speed for age/gender General Gait Details: Slow, controlled amb with supervision for safety  Stairs            Wheelchair Mobility    Modified Rankin (Stroke Patients Only)       Balance Overall balance assessment: No apparent balance deficits (not formally assessed)                                           Pertinent Vitals/Pain Pain Assessment: Faces Faces Pain Scale: Hurts a little bit Pain Location: Chest Pain Descriptors / Indicators: Sore Pain Intervention(s): Monitored during session    Home Living Family/patient expects to be discharged to:: Private residence Living Arrangements: Spouse/significant other Available Help at Discharge: Family;Available 24 hours/day Type of Home: House Home Access: Stairs to enter   Enter[employer] CorporationEntrance Stairs-Number of Steps: Threshold Home Layout: One level Home Equipment: Walker - 2 wheels;Wheelchair - manual Additional Comments: Lives with wife. Sister will be staying with pt at d/c to provide 24/7 support    Prior Function Level of Independence: Independent         Comments: Works as Pharmacist, communitydelivery driver     Hand Dominance        Extremity/Trunk Assessment   Upper Extremity Assessment Upper Extremity Assessment: Overall WFL for tasks assessed    Lower Extremity Assessment Lower Extremity Assessment: Overall WFL for tasks assessed       Communication   Communication: No difficulties  Cognition Arousal/Alertness: Awake/alert Behavior During Therapy: WFL for tasks assessed/performed Overall Cognitive Status: Within Functional Limits for tasks assessed  General Comments General comments (skin integrity, edema, etc.): Resting BP 149/80, post-amb BP 168/84    Exercises     Assessment/Plan    PT Assessment Patent does not need any further PT services  PT Problem List         PT Treatment Interventions      PT  Goals (Current goals can be found in the Care Plan section)  Acute Rehab PT Goals PT Goal Formulation: All assessment and education complete, DC therapy    Frequency     Barriers to discharge        Co-evaluation               AM-PAC PT "6 Clicks" Daily Activity  Outcome Measure Difficulty turning over in bed (including adjusting bedclothes, sheets and blankets)?: None Difficulty moving from lying on back to sitting on the side of the bed? : None Difficulty sitting down on and standing up from a chair with arms (e.g., wheelchair, bedside commode, etc,.)?: None Help needed moving to and from a bed to chair (including a wheelchair)?: None Help needed walking in hospital room?: A Little Help needed climbing 3-5 steps with a railing? : A Little 6 Click Score: 22    End of Session Equipment Utilized During Treatment: Gait belt Activity Tolerance: Patient tolerated treatment well Patient left: in bed;with call bell/phone within reach Nurse Communication: Mobility status PT Visit Diagnosis: Other abnormalities of gait and mobility (R26.89)    Time: 1610-9604 PT Time Calculation (min) (ACUTE ONLY): 23 min   Charges:   PT Evaluation $PT Eval Moderate Complexity: 1 Mod PT Treatments $Therapeutic Activity: 8-22 mins   PT G Codes:       Ina Homes, PT, DPT Acute Rehab Services  Pager: [phone]  Malachy Chamber 11/01/2017, 11:02 AM

## 2017-11-01 NOTE — Progress Notes (Addendum)
      [address]       [place] [id number]             [phone]        7 Days Post-Op Procedure(s) (LRB): CORONARY ARTERY BYPASS GRAFTING (CABG) times three, WITH ENDOSCOPIC HARVESTING OF RIGHT SAPHENOUS VEIN (N/A) TRANSESOPHAGEAL ECHOCARDIOGRAM (TEE) (N/A)  Subjective: Patient states breathing continues to improve. He has no specific complaints this am.  Objective: Vital signs in last 24 hours: Temp:  [98.2 F (36.8 C)-98.6 F (37 C)] 98.6 F (37 C) (04/05 0722) Pulse Rate:  [62-121] 121 (04/05 0600) Cardiac Rhythm: Normal sinus rhythm (04/04 2030) Resp:  [15-25] 18 (04/05 0600) BP: (110-161)/(61-90) 134/81 (04/05 0600) SpO2:  [94 %-100 %] 94 % (04/05 0600) FiO2 (%):  [28 %] 28 % (04/04 1500) Weight:  [224 lb (101.6 kg)] 224 lb (101.6 kg) (04/05 0500)  Pre op weight 110.4 kg Current Weight  11/01/17 224 lb (101.6 kg)      Intake/Output from previous day: 04/04 0701 - 04/05 0700 In: 200 [IV Piggyback:200] Out: 1900 [Urine:1900]   Physical Exam:  Cardiovascular: RRR Pulmonary: Slightly decreased at bases but coarse as heard previously Abdomen: Soft, non tender,  bowel sounds present. Extremities: Trace bilateral lower extremity edema. Wounds: Sternal wound is clean and dry. RLE wound is clean and dry.     Lab Results: CBC: Recent Labs    10/31/17 1134 11/01/17 0423  WBC 18.5* 20.9*  HGB 8.9* 8.3*  HCT 26.8* 25.8*  PLT 400 441*   BMET:  Recent Labs    10/31/17 1205 11/01/17 0423  NA 137 134*  K 4.2 4.4  CL 96* 96*  CO2 26 25  GLUCOSE 132* 148*  BUN 24* 27*  CREATININE 0.92 0.93  CALCIUM 8.7* 8.4*    PT/INR:  Lab Results  Component Value Date   INR 1.38 10/27/2017   INR 1.40 10/25/2017   INR 1.27 10/25/2017   ABG:  INR: Will add last result for INR, ABG once components are confirmed Will add last 4 CBG results once components are confirmed  Assessment/Plan:  1. CV - S/p inferior MI, V fib arrest, cardiogenic  shock. SR in the 60's this am. On Lopressor 12.5 mg bid. Could consider starting low dose ACE for better BP control. 2.  Pulmonary - On 2 liters of oxygen via [place]. On Solumedrol. CXR this am appears stable (bibasilar atelectasis, improving interstitial edema). Encourage incentive spirometer. Appreciate pulmonary's assistance 3. Volume Overload - On Lasix 40 mg IV bid.  Hope to transition to oral Lasix soon 4.  Acute blood loss anemia -  H and H this am stable at  8.3 and 25.8. Continue Trinsicon. 5. DM-CBGs 156/153/169. On  Metformin and stop scheduled Insulin. Pre op HGA1C 6.2. 6. Mild hyponatremia-sodium 134. Related to diuresis 7. Will remove EPW later this am 8. Hope to transfer to 4E if bed available  Donielle M ZimmermanPA-C 11/01/2017,7:36 AM  Much improved- tx to stepdown , wean O2, wean off steroids patient examined and medical record reviewed,agree with above note. Kathlee Nationseter Van Trigt III 11/01/2017

## 2017-11-01 NOTE — Progress Notes (Signed)
PULMONARY / CRITICAL CARE MEDICINE   Name: Eddie Black MRN: [id number] DOB: 01-Dec-1960    ADMISSION DATE:  10/25/2017 CONSULTATION DATE: 10/28/2017  REFERRING MD:  Donata Clay CHIEF COMPLAINT: shortness of breath   HISTORY OF PRESENT ILLNESS:   Eddie Black is a 57 yr old who was admitted post vfib arrest on 10/25/2017 and was found to have multivessel disease and was taken for emergent CABG X3. Was extubated post operatively but started desaturating 4/1 from 4 litres oxygen to NRBFM and BIPAP 100% FIO2 .  He is known with DM, HTN Hyperlipidemia. He is also a smoker of more than 78yrs, 1/2 PPD  Diuresed, & now off bipap to    Interval Hx: Breathing much better, on RA, oob to chair Denies CP Good UO with lasix    VITAL SIGNS: BP (!) 142/77   Pulse 69   Temp 98.6 F (37 C) (Oral)   Resp 20   Ht 6\' 1"  (1.854 m)   Wt 224 lb (101.6 kg)   SpO2 97%   BMI 29.55 kg/m   HEMODYNAMICS:    VENTILATOR SETTINGS: FiO2 (%):  [28 %] 28 %  INTAKE / OUTPUT: I/O last 3 completed shifts: In: 341.3 [I.V.:91.3; IV Piggyback:250] Out: 3175 [Urine:3175]  PHYSICAL EXAMINATION: General:  On RA, no distress, talking full sentences Neuro:  Alert oriented moving all extremities  HEENT:  Moist  Cardiovascular:  Normal heart sounds no murmurs  Lungs: clear BL, no rhonchi Abdomen: soft no tenderness no megally  Musculoskeletal:  No  edema  Skin:  No rash   LABS:  BMET Recent Labs  Lab 10/31/17 0510 10/31/17 1205 11/01/17 0423  NA 135 137 134*  K 4.5 4.2 4.4  CL 98* 96* 96*  CO2 26 26 25   BUN 23* 24* 27*  CREATININE 0.86 0.92 0.93  GLUCOSE 147* 132* 148*    Electrolytes Recent Labs  Lab 10/26/17 0431 10/26/17 1538  10/31/17 0510 10/31/17 1205 11/01/17 0423  CALCIUM 7.8*  --    < > 8.6* 8.7* 8.4*  MG 2.4 1.9  --   --   --   --    < > = values in this interval not displayed.    CBC Recent Labs  Lab 10/30/17 0500 10/31/17 1134 11/01/17 0423  WBC 12.7* 18.5* 20.9*   HGB 8.1* 8.9* 8.3*  HCT 24.1* 26.8* 25.8*  PLT 266 400 441*    Coag's Recent Labs  Lab 10/25/17 1529 10/25/17 2254 10/27/17 0351  APTT >200* 27 53*  INR 1.27 1.40 1.38    Sepsis Markers No results for input(s): LATICACIDVEN, PROCALCITON, O2SATVEN in the last 168 hours.  ABG Recent Labs  Lab 10/28/17 1232 10/28/17 1328 10/28/17 1730  PHART 7.428 7.438 7.459*  PCO2ART 39.1 38.7 39.3  PO2ART 45.0* 52.0* 58.0*    Liver Enzymes Recent Labs  Lab 10/30/17 0500 10/31/17 0510 11/01/17 0423  AST 19 21 25   ALT 33 34 40  ALKPHOS 49 56 55  BILITOT 0.8 1.0 0.8  ALBUMIN 2.7* 2.9* 2.8*    Cardiac Enzymes Recent Labs  Lab 10/25/17 1529  TROPONINI 0.10*    Glucose Recent Labs  Lab 10/31/17 0726 10/31/17 1147 10/31/17 1557 10/31/17 1926 11/01/17 0315 11/01/17 0719  GLUCAP 140* 139* 170* 156* 153* 169*    Imaging Dg Chest Port 1 View  Result Date: 11/01/2017 CLINICAL DATA:  Status post coronary artery bypass grafting for coronary artery disease. Hypertension. EXAM: PORTABLE CHEST 1 VIEW COMPARISON:  October 31, 2017  FINDINGS: Central catheter tip is in the superior vena cava. Temporary pacemaker wires are attached to the right heart. No pneumothorax. There is patchy atelectasis in both lower lobes. Lungs elsewhere are clear. Heart is upper normal in size with pulmonary vascularity within normal limits. There is aortic atherosclerosis. No adenopathy. No bone lesions. IMPRESSION: Bibasilar atelectasis. No edema or consolidation. Stable cardiac prominence. Central catheter as described without pneumothorax. There is aortic atherosclerosis. Aortic Atherosclerosis (ICD10-I70.0). Electronically Signed   By: Bretta BangWilliam  Woodruff III M.D.   On: 11/01/2017 07:54     STUDIES:  Echo 10/25/2017 EF50-55%     ANTIBIOTICS: Cefazolin Start zosyn 4/1   DISCUSSION: 57 yr old male with post vfib arrest s/p CABG x3 on 3/29, DM, HTN tobacco abuser now with acute hypoxemic respiratory  failure secondary to COPD exacerbation and acute pulmonary edema and acute diastolic heart failure. Infx have cleared so doubt aspiration  ASSESSMENT / PLAN:  PULMONARY A: acute hypoxemic resp failure requiring highflow nasal canula secondary to COPD exacerbation and pulm edema with suspected PNA -resolved with diuresis COPD underlying> Doubt Aspiration pneumonia Acute pulm edema   P:   - dc IV solumedrol , pred 20 mg daily x 2 ds, then stop now that bspasm resolved - on scheduled xopenex  - incentive spirometry  - dc zosyn     CARDIOVASCULAR A:  V fib arrest CAD  S/p CABGX3 3/29    P:  -  Lasix  per thoracic Sx   -Expect leucocytosis & hyperglycemia to improve off steroids Can provide  albuterol MDI on discharge, obtain PFTs as outpt once acute issues resolved & assessneed for other COPD meds  PCCM availabale as needed  Eddie Mourningakesh [name] MD. FCCP. [facility] Pager [phone] If no response call [phone]    11/01/2017, 8:38 AM

## 2017-11-01 NOTE — Progress Notes (Signed)
CARDIAC REHAB PHASE I   PRE:  Rate/Rhythm: 70 SR  BP:  Supine: 154/91  Sitting:   Standing:    SaO2: 96%RA  MODE:  Ambulation: 470 ft   POST:  Rate/Rhythm: 96 SR  BP:  Supine:   Sitting:   Standing:  To bathroom    SaO2: 96%RA 1340-1400 Pt had already walked twice today. Pt walked 470 ft on RA with hand held asst with steady gait. Tolerated well. To bathroom after walk. Notified pt's RN. Pt to use call light. RA sats good on RA.  Luetta Nuttingharlene Dayna Alia, RN BSN  11/01/2017 1:56 PM

## 2017-11-01 NOTE — Progress Notes (Signed)
DAILY PROGRESS NOTE   Patient Name: Eddie Black Date of Encounter: 11/01/2017  Chief Complaint   Breathing continues to improve  Patient Profile   Eddie Black is a 57 y.o. male with a history of newly diagnosed DM, HTN, HLD and h/o tobacco abuse, presenting to the [facility] ED via EMS for out of hospital Vfib arrest. ROSC achieved with CPR + defibrillation. Post defibrillation EKG showed atrial fibrillation with widespread ST elevations.    Subjective   Improving oxygenation - now on 2L [place] - CXR demonstrates resolving edema. Now 10L negative. Creatinine stable.  Objective   Vitals:   11/01/17 0500 11/01/17 0600 11/01/17 0722 11/01/17 0802  BP: 131/61 134/81    Pulse: 62 (!) 121    Resp: 15 18    Temp:   98.6 F (37 C)   TempSrc:   Oral   SpO2: 94% 94%  97%  Weight: 224 lb (101.6 kg)     Height:        Intake/Output Summary (Last 24 hours) at 11/01/2017 0825 Last data filed at 11/01/2017 0800 Gross per 24 hour  Intake 422 ml  Output 1500 ml  Net -1078 ml   Filed Weights   10/30/17 0600 10/31/17 0600 11/01/17 0500  Weight: 229 lb 15 oz (104.3 kg) 221 lb 5.5 oz (100.4 kg) 224 lb (101.6 kg)    Physical Exam   General appearance: alert, no distress and morbidly obese Neck: no carotid bruit, no JVD and thyroid not enlarged, symmetric, no tenderness/mass/nodules Lungs: clear to auscultation bilaterally Heart: regular rate and rhythm Abdomen: soft, non-tender; bowel sounds normal; no masses,  no organomegaly Extremities: edema trace RLE Pulses: 2+ and symmetric Skin: Skin color, texture, turgor normal. No rashes or lesions Neurologic: Grossly normal Psych: Pleasant  Inpatient Medications    Scheduled Meds: . aspirin EC  325 mg Oral Daily   Or  . aspirin  324 mg Per Tube Daily  . atorvastatin  40 mg Oral q1800  . bisacodyl  10 mg Oral Daily   Or  . bisacodyl  10 mg Rectal Daily  . Chlorhexidine Gluconate Cloth  6 each Topical Daily  . docusate sodium  200 mg  Oral Daily  . enoxaparin (LOVENOX) injection  40 mg Subcutaneous Q24H  . feeding supplement (GLUCERNA SHAKE)  237 mL Oral TID BM  . ferrous [id number] C-folic acid  1 capsule Oral BID  . furosemide  40 mg Intravenous BID  . guaiFENesin  600 mg Oral BID  . levalbuterol  0.63 mg Nebulization TID  . mouth rinse  15 mL Mouth Rinse BID  . metFORMIN  500 mg Oral BID WC  . methylPREDNISolone (SOLU-MEDROL) injection  40 mg Intravenous Q6H  . metoprolol tartrate  12.5 mg Oral BID   Or  . metoprolol tartrate  12.5 mg Per Tube BID  . pantoprazole  40 mg Oral Daily    Continuous Infusions: . sodium chloride    . sodium chloride 10 mL/hr at 10/27/17 1630  . sodium chloride    . lactated ringers 10 mL/hr at 10/27/17 1100  . piperacillin-tazobactam (ZOSYN)  IV Stopped (11/01/17 0606)    PRN Meds: Place/Maintain arterial line **AND** sodium chloride, metoprolol tartrate, ondansetron (ZOFRAN) IV, oxyCODONE, pneumococcal 23 valent vaccine, sodium chloride flush, traMADol   Labs   Results for orders placed or performed during the hospital encounter of 10/25/17 (from the past 48 hour(s))  Glucose, capillary     Status: Abnormal   Collection Time: 10/30/17  11:23 AM  Result Value Ref Range   Glucose-Capillary 134 (H) 65 - 99 mg/dL   Comment 1 Notify RN   Glucose, capillary     Status: Abnormal   Collection Time: 10/30/17  4:08 PM  Result Value Ref Range   Glucose-Capillary 171 (H) 65 - 99 mg/dL   Comment 1 Notify RN   Glucose, capillary     Status: Abnormal   Collection Time: 10/30/17  7:16 PM  Result Value Ref Range   Glucose-Capillary 184 (H) 65 - 99 mg/dL  Glucose, capillary     Status: Abnormal   Collection Time: 10/30/17 11:40 PM  Result Value Ref Range   Glucose-Capillary 161 (H) 65 - 99 mg/dL   Comment 1 Notify RN   Glucose, capillary     Status: Abnormal   Collection Time: 10/31/17  3:36 AM  Result Value Ref Range   Glucose-Capillary 134 (H) 65 - 99 mg/dL   Comment 1  Notify RN   Comprehensive metabolic panel     Status: Abnormal   Collection Time: 10/31/17  5:10 AM  Result Value Ref Range   Sodium 135 135 - 145 mmol/L   Potassium 4.5 3.5 - 5.1 mmol/L    Comment: DELTA CHECK NOTED   Chloride 98 (L) 101 - 111 mmol/L   CO2 26 22 - 32 mmol/L   Glucose, Bld 147 (H) 65 - 99 mg/dL   BUN 23 (H) 6 - 20 mg/dL   Creatinine, Ser 0.86 0.61 - 1.24 mg/dL   Calcium 8.6 (L) 8.9 - 10.3 mg/dL   Total Protein 5.7 (L) 6.5 - 8.1 g/dL   Albumin 2.9 (L) 3.5 - 5.0 g/dL   AST 21 15 - 41 U/L   ALT 34 17 - 63 U/L   Alkaline Phosphatase 56 38 - 126 U/L   Total Bilirubin 1.0 0.3 - 1.2 mg/dL   GFR calc non Af Amer >60 >60 mL/min   GFR calc Af Amer >60 >60 mL/min    Comment: (NOTE) The eGFR has been calculated using the CKD EPI equation. This calculation has not been validated in all clinical situations. eGFR's persistently <60 mL/min signify possible Chronic Kidney Disease.    Anion gap 11 5 - 15    Comment: Performed at [facility] [address], [place], [place] [address]  .Cooxemetry Panel (carboxy, met, total hgb, O2 sat)     Status: None   Collection Time: 10/31/17  5:25 AM  Result Value Ref Range   Total hemoglobin 12.1 12.0 - 16.0 g/dL   O2 Saturation 46.8 %   Carboxyhemoglobin 0.8 0.5 - 1.5 %   Methemoglobin 1.4 0.0 - 1.5 %  Glucose, capillary     Status: Abnormal   Collection Time: 10/31/17  7:26 AM  Result Value Ref Range   Glucose-Capillary 140 (H) 65 - 99 mg/dL  CBC with Differential/Platelet     Status: Abnormal   Collection Time: 10/31/17 11:34 AM  Result Value Ref Range   WBC 18.5 (H) 4.0 - 10.5 K/uL    Comment: RESULTS VERIFIED VIA RECOLLECT   RBC 2.95 (L) 4.22 - 5.81 MIL/uL   Hemoglobin 8.9 (L) 13.0 - 17.0 g/dL   HCT 26.8 (L) 39.0 - 52.0 %   MCV 90.8 78.0 - 100.0 fL   MCH 30.2 26.0 - 34.0 pg   MCHC 33.2 30.0 - 36.0 g/dL   RDW 13.7 11.5 - 15.5 %   Platelets 400 150 - 400 K/uL   Neutrophils Relative %  81 %   Lymphocytes Relative 8 %    Monocytes Relative 11 %   Eosinophils Relative 0 %   Basophils Relative 0 %   Neutro Abs 15.0 (H) 1.7 - 7.7 K/uL   Lymphs Abs 1.5 0.7 - 4.0 K/uL   Monocytes Absolute 2.0 (H) 0.1 - 1.0 K/uL   Eosinophils Absolute 0.0 0.0 - 0.7 K/uL   Basophils Absolute 0.0 0.0 - 0.1 K/uL   RBC Morphology POLYCHROMASIA PRESENT     Comment: Performed at [facility] [address] [place], [place] [address]  Glucose, capillary     Status: Abnormal   Collection Time: 10/31/17 11:47 AM  Result Value Ref Range   Glucose-Capillary 139 (H) 65 - 99 mg/dL   Comment 1 Capillary Specimen    Comment 2 Notify RN   Basic metabolic panel     Status: Abnormal   Collection Time: 10/31/17 12:05 PM  Result Value Ref Range   Sodium 137 135 - 145 mmol/L   Potassium 4.2 3.5 - 5.1 mmol/L   Chloride 96 (L) 101 - 111 mmol/L   CO2 26 22 - 32 mmol/L   Glucose, Bld 132 (H) 65 - 99 mg/dL   BUN 24 (H) 6 - 20 mg/dL   Creatinine, Ser 0.92 0.61 - 1.24 mg/dL   Calcium 8.7 (L) 8.9 - 10.3 mg/dL   GFR calc non Af Amer >60 >60 mL/min   GFR calc Af Amer >60 >60 mL/min    Comment: (NOTE) The eGFR has been calculated using the CKD EPI equation. This calculation has not been validated in all clinical situations. eGFR's persistently <60 mL/min signify possible Chronic Kidney Disease.    Anion gap 15 5 - 15    Comment: Performed at [facility] [address] [place], [place] [address]  Glucose, capillary     Status: Abnormal   Collection Time: 10/31/17  3:57 PM  Result Value Ref Range   Glucose-Capillary 170 (H) 65 - 99 mg/dL  Glucose, capillary     Status: Abnormal   Collection Time: 10/31/17  7:26 PM  Result Value Ref Range   Glucose-Capillary 156 (H) 65 - 99 mg/dL   Comment 1 Notify RN   Glucose, capillary     Status: Abnormal   Collection Time: 11/01/17  3:15 AM  Result Value Ref Range   Glucose-Capillary 153 (H) 65 - 99 mg/dL   Comment 1 Capillary Specimen   CBC     Status: Abnormal   Collection Time:  11/01/17  4:23 AM  Result Value Ref Range   WBC 20.9 (H) 4.0 - 10.5 K/uL   RBC 2.83 (L) 4.22 - 5.81 MIL/uL   Hemoglobin 8.3 (L) 13.0 - 17.0 g/dL   HCT 25.8 (L) 39.0 - 52.0 %   MCV 91.2 78.0 - 100.0 fL   MCH 29.3 26.0 - 34.0 pg   MCHC 32.2 30.0 - 36.0 g/dL   RDW 13.5 11.5 - 15.5 %   Platelets 441 (H) 150 - 400 K/uL    Comment: Performed at [facility], [place]. [address] [place], [place] [address]  Comprehensive metabolic panel     Status: Abnormal   Collection Time: 11/01/17  4:23 AM  Result Value Ref Range   Sodium 134 (L) 135 - 145 mmol/L   Potassium 4.4 3.5 - 5.1 mmol/L   Chloride 96 (L) 101 - 111 mmol/L   CO2 25 22 - 32 mmol/L   Glucose, Bld 148 (H) 65 -  99 mg/dL   BUN 27 (H) 6 - 20 mg/dL   Creatinine, Ser 0.93 0.61 - 1.24 mg/dL   Calcium 8.4 (L) 8.9 - 10.3 mg/dL   Total Protein 6.3 (L) 6.5 - 8.1 g/dL   Albumin 2.8 (L) 3.5 - 5.0 g/dL   AST 25 15 - 41 U/L   ALT 40 17 - 63 U/L   Alkaline Phosphatase 55 38 - 126 U/L   Total Bilirubin 0.8 0.3 - 1.2 mg/dL   GFR calc non Af Amer >60 >60 mL/min   GFR calc Af Amer >60 >60 mL/min    Comment: (NOTE) The eGFR has been calculated using the CKD EPI equation. This calculation has not been validated in all clinical situations. eGFR's persistently <60 mL/min signify possible Chronic Kidney Disease.    Anion gap 13 5 - 15    Comment: Performed at [facility] [address] [place], [place] [address]  Glucose, capillary     Status: Abnormal   Collection Time: 11/01/17  7:19 AM  Result Value Ref Range   Glucose-Capillary 169 (H) 65 - 99 mg/dL   Comment 1 Capillary Specimen    Comment 2 Notify RN     ECG   N/A  Telemetry   Sinus rhythm - Personally Reviewed  Radiology    Dg Chest Port 1 View  Result Date: 11/01/2017 CLINICAL DATA:  Status post coronary artery bypass grafting for coronary artery disease. Hypertension. EXAM: PORTABLE CHEST 1 VIEW COMPARISON:  October 31, 2017 FINDINGS: Central catheter tip is in the  superior vena cava. Temporary pacemaker wires are attached to the right heart. No pneumothorax. There is patchy atelectasis in both lower lobes. Lungs elsewhere are clear. Heart is upper normal in size with pulmonary vascularity within normal limits. There is aortic atherosclerosis. No adenopathy. No bone lesions. IMPRESSION: Bibasilar atelectasis. No edema or consolidation. Stable cardiac prominence. Central catheter as described without pneumothorax. There is aortic atherosclerosis. Aortic Atherosclerosis (ICD10-I70.0). Electronically Signed   By: Lowella Grip III M.D.   On: 11/01/2017 07:54   Dg Chest Port 1 View  Result Date: 10/31/2017 CLINICAL DATA:  Pleural effusion CABG EXAM: PORTABLE CHEST 1 VIEW COMPARISON:  10/30/2017 FINDINGS: Left chest tube removed. No pneumothorax. Right arm PICC tip at the cavoatrial junction unchanged Improved lung volume. Bibasilar atelectasis remains. No significant pleural effusion IMPRESSION: Left chest tube removed.  No pneumothorax Bibasilar atelectasis with improved lung volume. Electronically Signed   By: Franchot Gallo M.D.   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  Coronary artery disease. Chronic obstructive pulmonary disease.  PMH:   Myocardial infarction.  Sudden death episode.  Risk factors:  Hypertension. Diabetes mellitus.  ------------------------------------------------------------------- Study Conclusions  - Impressions: Limited study. LV EF appears normal With definity   there is no right to left shunt seen on bubble study.  Impressions:  - Limited study. LV EF appears normal With definity there is no   right to left shunt seen on bubble study.  Assessment   Principal Problem:   Cardiac arrest [facility]) Active Problems:   Diabetes mellitus type II, controlled ([place])   Hyperlipidemia LDL goal <70   Essential hypertension   Tobacco abuse   CAD (coronary artery disease),  native coronary artery   Ventricular fibrillation (HCC)   MI, acute, non ST segment elevation (HCC)   S/P CABG x 3   Acute hypoxemic respiratory failure ([place])  COPD exacerbation ([place])   Aspiration pneumonia due to gastric secretions (HCC)   Hypokalemia   Plan   1. Much improved overnight- on minimal oxygen at this point. Creatinine stable with diuresis. Would continue IV diuresis today and switch to po lasix tomorrow.    Time Spent Directly with Patient:  I have spent a total of 25 minutes with the patient reviewing hospital notes, telemetry, EKGs, labs and examining the patient as well as establishing an assessment and plan that was discussed personally with the patient. > 50% of time was spent in direct patient care.  Length of Stay:  LOS: 7 days   Pixie Casino, MD, [facility], [place] Director of the Advanced Lipid Disorders &  Cardiovascular Risk Reduction Clinic Diplomate of the American Board of Clinical Lipidology Attending Cardiologist  Direct Dial: [phone]  Fax: [phone]  Website:  [email or web address]  Nadean Corwin Laynie Espy 11/01/2017, 8:25 AM

## 2017-11-02 ENCOUNTER — Inpatient Hospital Stay (HOSPITAL_COMMUNITY): Payer: BLUE CROSS/BLUE SHIELD

## 2017-11-02 LAB — BASIC METABOLIC PANEL
ANION GAP: 11 (ref 5–15)
BUN: 26 mg/dL — ABNORMAL HIGH (ref 6–20)
CHLORIDE: 98 mmol/L — AB (ref 101–111)
CO2: 26 mmol/L (ref 22–32)
Calcium: 8.7 mg/dL — ABNORMAL LOW (ref 8.9–10.3)
Creatinine, Ser: 0.87 mg/dL (ref 0.61–1.24)
GFR calc Af Amer: >60 mL/min (ref >60)
Glucose, Bld: 106 mg/dL — ABNORMAL HIGH (ref 65–99)
Potassium: 3.8 mmol/L (ref 3.5–5.1)
SODIUM: 135 mmol/L (ref 135–145)

## 2017-11-02 LAB — CBC
HCT: 28.4 % — ABNORMAL LOW (ref 39.0–52.0)
HEMOGLOBIN: 9.4 g/dL — AB (ref 13.0–17.0)
MCH: 30.4 pg (ref 26.0–34.0)
MCHC: 33.1 g/dL (ref 30.0–36.0)
MCV: 91.9 fL (ref 78.0–100.0)
PLATELETS: 542 10*3/uL — AB (ref 150–400)
RBC: 3.09 MIL/uL — AB (ref 4.22–5.81)
RDW: 13.8 % (ref 11.5–15.5)
WBC: 20.3 10*3/uL — AB (ref 4.0–10.5)

## 2017-11-02 LAB — GLUCOSE, CAPILLARY
Glucose-Capillary: 103 mg/dL — ABNORMAL HIGH (ref 65–99)
Glucose-Capillary: 138 mg/dL — ABNORMAL HIGH (ref 65–99)
Glucose-Capillary: 154 mg/dL — ABNORMAL HIGH (ref 65–99)
Glucose-Capillary: 116 mg/dL — ABNORMAL HIGH (ref 65–99)

## 2017-11-02 MED ORDER — POTASSIUM CHLORIDE CRYS ER 20 MEQ PO TBCR
40.0000 meq | EXTENDED_RELEASE_TABLET | Freq: Every day | ORAL | Status: DC
  Administered 2017-11-02 – 2017-11-03 (×2): 40 meq via ORAL
  Filled 2017-11-02 (×2): qty 2

## 2017-11-02 MED ORDER — FUROSEMIDE 40 MG PO TABS: 40.0000 mg | ORAL_TABLET | Freq: Every day | ORAL | Status: DC

## 2017-11-02 MED ORDER — FUROSEMIDE 40 MG PO TABS
40.0000 mg | ORAL_TABLET | Freq: Two times a day (BID) | ORAL | Status: DC
  Administered 2017-11-02 – 2017-11-03 (×3): 40 mg via ORAL
  Filled 2017-11-02 (×3): qty 1

## 2017-11-02 MED ORDER — MOVING RIGHT ALONG BOOK
Freq: Once | Status: AC
  Administered 2017-11-02: 18:00:00
  Filled 2017-11-02 (×2): qty 1

## 2017-11-02 MED ORDER — POTASSIUM CHLORIDE CRYS ER 20 MEQ PO TBCR
20.0000 meq | EXTENDED_RELEASE_TABLET | Freq: Once | ORAL | Status: AC
  Administered 2017-11-02: 20 meq via ORAL
  Filled 2017-11-02: qty 1

## 2017-11-02 NOTE — Progress Notes (Signed)
CARDIAC REHAB PHASE I   [phone] Pt already ambulated x2 this am with no complaints. Education started with patient and then he went to xray.  Great use of sternal precautions when getting out of bed to wheelchair.  RN looked for Moving Right Along book while pt away as per pt he did not receive.  Bedside RN made aware of this and to place order for pt to have book for reference at home.  Education continued upon return from xray including smoking cessation, sternal precautions, nutrition for diabetes and heart healthy, exercise guidelines, and CRP II.  All questions answered and pt verbalized understanding. Handouts and fake cigarette given to patient. Pt eager to continue his smoking cessation.   Referral made to CRP II [place].    Nikki Domverett, Garry Nicolini G, RN 11/02/2017 10:02 AM

## 2017-11-02 NOTE — Progress Notes (Signed)
Patient walked twice this morning before 0700. Tolerated well. No assistive device used.

## 2017-11-02 NOTE — Progress Notes (Addendum)
      [address]       [place] [id number]             [phone]        8 Days Post-Op Procedure(s) (LRB): CORONARY ARTERY BYPASS GRAFTING (CABG) times three, WITH ENDOSCOPIC HARVESTING OF RIGHT SAPHENOUS VEIN (N/A) TRANSESOPHAGEAL ECHOCARDIOGRAM (TEE) (N/A)  Subjective: Patient has no complaints this am. He hopes to go home in am.  Objective: Vital signs in last 24 hours: Temp:  [98.2 F (36.8 C)-98.5 F (36.9 C)] 98.2 F (36.8 C) (04/06 0714) Pulse Rate:  [60-144] 60 (04/06 0714) Cardiac Rhythm: Normal sinus rhythm (04/06 0701) Resp:  [12-27] 14 (04/06 0714) BP: (123-161)/(73-84) 135/84 (04/06 0714) SpO2:  [90 %-98 %] 90 % (04/06 0714) Weight:  [220 lb 11.2 oz (100.1 kg)] 220 lb 11.2 oz (100.1 kg) (04/06 0555)  Pre op weight 110.4 kg Current Weight  11/02/17 220 lb 11.2 oz (100.1 kg)       Intake/Output from previous day: 04/05 0701 - 04/06 0700 In: 942 [P.O.:942] Out: 926 [Urine:925; Stool:1]   Physical Exam:  Cardiovascular: RRR Pulmonary: Clear to auscultation bilaterally Abdomen: Soft, non tender, bowel sounds present. Extremities: Trace bilateral lower extremity edema. Wounds: Clean and dry.  No erythema or signs of infection.  Lab Results: CBC: Recent Labs    11/01/17 0423 11/02/17 0550  WBC 20.9* 20.3*  HGB 8.3* 9.4*  HCT 25.8* 28.4*  PLT 441* 542*   BMET:  Recent Labs    11/01/17 0423 11/02/17 0550  NA 134* 135  K 4.4 3.8  CL 96* 98*  CO2 25 26  GLUCOSE 148* 106*  BUN 27* 26*  CREATININE 0.93 0.87  CALCIUM 8.4* 8.7*    PT/INR:  Lab Results  Component Value Date   INR 1.38 10/27/2017   INR 1.40 10/25/2017   INR 1.27 10/25/2017   ABG:  INR: Will add last result for INR, ABG once components are confirmed Will add last 4 CBG results once components are confirmed  Assessment/Plan: 1. CV - S/p inferior MI, V fib arrest, cardiogenic shock. SR in the 60's this am. On Lopressor 12.5 mg bid. Will restart low  dose Enalapril in am. 2.  Pulmonary - On room air.  CXR ordered but not taken yet.Encourage incentive spirometer. Appreciate pulmonary's assistance 3. Volume Overload - On Lasix 40 mg IV bid and has been the last several days. Will change to oral as has had excellent diuresis and is below pre op weight. 4.  Acute blood loss anemia -  H and H this am stable at 9.4 and 28.4. Continue Trinsicon. 5. DM-CBGs 135/219/103. On Metformin. Pre op HGA1C 6.2. 6. Supplement potassium     Donielle M ZimmermanPA-C 11/02/2017,7:42 AM Patient seen and examined, agree with above CXR this AM looks good Home tomorrow if he continues to progress  Viviann SpareSteven C. Dorris FetchHendrickson, MD [facility] [phone]

## 2017-11-02 NOTE — Progress Notes (Signed)
Patient off the floor and to xray at this time.  Ernestina ColumbiaK. Starr Hiyab Nhem, RN

## 2017-11-03 LAB — GLUCOSE, CAPILLARY: Glucose-Capillary: 116 mg/dL — ABNORMAL HIGH (ref 65–99)

## 2017-11-03 MED ORDER — ASPIRIN 325 MG PO TBEC: 325.0000 mg | DELAYED_RELEASE_TABLET | Freq: Every day | ORAL | 0 refills | Status: DC

## 2017-11-03 MED ORDER — METOPROLOL TARTRATE 25 MG PO TABS: 12.5000 mg | ORAL_TABLET | Freq: Two times a day (BID) | ORAL | 1 refills | Status: DC

## 2017-11-03 MED ORDER — ENALAPRIL MALEATE 2.5 MG PO TABS: 2.5000 mg | ORAL_TABLET | Freq: Every day | ORAL | 1 refills | Status: DC

## 2017-11-03 MED ORDER — POTASSIUM CHLORIDE CRYS ER 20 MEQ PO TBCR: 20.0000 meq | EXTENDED_RELEASE_TABLET | Freq: Every day | ORAL | 1 refills | Status: DC

## 2017-11-03 MED ORDER — FUROSEMIDE 40 MG PO TABS: 40.0000 mg | ORAL_TABLET | Freq: Every day | ORAL | 1 refills | Status: DC

## 2017-11-03 MED ORDER — ENALAPRIL MALEATE 2.5 MG PO TABS
2.5000 mg | ORAL_TABLET | Freq: Every day | ORAL | Status: DC
  Administered 2017-11-03: 2.5 mg via ORAL
  Filled 2017-11-03: qty 1

## 2017-11-03 MED ORDER — FERROUS SULFATE 325 (65 FE) MG PO TABS: 325.0000 mg | ORAL_TABLET | Freq: Every day | ORAL | 3 refills | Status: DC

## 2017-11-03 MED ORDER — OXYCODONE HCL 5 MG PO TABS: ORAL_TABLET | ORAL | 0 refills | Status: DC

## 2017-11-03 MED ORDER — FUROSEMIDE 40 MG PO TABS: 40.0000 mg | ORAL_TABLET | Freq: Two times a day (BID) | ORAL | 1 refills | Status: DC

## 2017-11-03 NOTE — Care Management Note (Signed)
Case Management Note Donn PieriniKristi Ryland Tungate RN, BSN Unit 4E-Case Manager-- 2H coverage [phone]  Patient Details  Name: Eddie Black MRN: [id number] Date of Birth: 10/29/1960  Subjective/Objective:  Pt admitted  S/p out of hospital Vfib arrest                Action/Plan: PTA pt lived at home with spouse- anticipate return home- CM to follow for transition needs.   Expected Discharge Date:  11/03/17               Expected Discharge Plan:  Home/Self Care  In-House Referral:     Discharge planning Services  CM Consult  Post Acute Care Choice:    Choice offered to:  NA  DME Arranged:    DME Agency:     HH Arranged:    HH Agency:     Status of Service:  Completed, signed off  If discussed at [employer] Length of Stay Meetings, dates discussed:    Discharge Disposition: home/self care   Additional Comments:  11/03/17- 1020- [name] RN, CM- pt for d/c home today- no CM needs noted for transition home.   [name], [name], RN 11/03/2017, 10:21 AM

## 2017-11-03 NOTE — Progress Notes (Addendum)
      [address]       [place] [id number]             [phone]        9 Days Post-Op Procedure(s) (LRB): CORONARY ARTERY BYPASS GRAFTING (CABG) times three, WITH ENDOSCOPIC HARVESTING OF RIGHT SAPHENOUS VEIN (N/A) TRANSESOPHAGEAL ECHOCARDIOGRAM (TEE) (N/A)  Subjective: Patient feeling very well and wants to go home.  Objective: Vital signs in last 24 hours: Temp:  [97.8 F (36.6 C)-98.8 F (37.1 C)] 98.4 F (36.9 C) (04/07 0332) Pulse Rate:  [66-81] 71 (04/07 0332) Cardiac Rhythm: Normal sinus rhythm (04/07 0701) Resp:  [13-18] 17 (04/07 0332) BP: (108-135)/(64-80) 116/67 (04/07 0332) SpO2:  [96 %-97 %] 97 % (04/07 0332) Weight:  [209 lb 6.4 oz (95 kg)] 209 lb 6.4 oz (95 kg) (04/07 0332)  Pre op weight 110.4 kg Current Weight  11/03/17 209 lb 6.4 oz (95 kg)       Intake/Output from previous day: 04/06 0701 - 04/07 0700 In: 850 [P.O.:840; I.V.:10] Out: -    Physical Exam:  Cardiovascular: RRR Pulmonary: Clear to auscultation bilaterally Abdomen: Soft, non tender, bowel sounds present. Extremities: No lower extremity edema. Wounds: Clean and dry.  No erythema or signs of infection.  Lab Results: CBC: Recent Labs    11/01/17 0423 11/02/17 0550  WBC 20.9* 20.3*  HGB 8.3* 9.4*  HCT 25.8* 28.4*  PLT 441* 542*   BMET:  Recent Labs    11/01/17 0423 11/02/17 0550  NA 134* 135  K 4.4 3.8  CL 96* 98*  CO2 25 26  GLUCOSE 148* 106*  BUN 27* 26*  CREATININE 0.93 0.87  CALCIUM 8.4* 8.7*    PT/INR:  Lab Results  Component Value Date   INR 1.38 10/27/2017   INR 1.40 10/25/2017   INR 1.27 10/25/2017   ABG:  INR: Will add last result for INR, ABG once components are confirmed Will add last 4 CBG results once components are confirmed  Assessment/Plan: 1. CV - S/p inferior MI, V fib arrest, cardiogenic shock. SR in the 60's this am. On Lopressor 12.5 mg bid. Will restart low dose Enalapril. 2.  Pulmonary - On room air.   Encourage incentive spirometer. 3. Volume Overload - On Lasix 40 mg PO bid. Will discharge on daily Lasix. 4.  Acute blood loss anemia -  H and H this am stable at 9.4 and 28.4. Continue Trinsicon. 5. DM-CBGs 154/116/116. On Metformin. Pre op HGA1C 6.2. 6. Remove sutures and PICC line 7. Restart Levothyroxine at discharge 8. Discharge     Prinston Kynard M ZimmermanPA-C 11/03/2017,7:32 AM

## 2017-11-03 NOTE — Progress Notes (Signed)
Patient in a stable condition, discharge orders reviewed with patient and his wife at bedside, they verbalized understanding, sutures removed, tele dc ccmd notified, patient belongings at bedside, prescriptions given to patient, patient's wife to transport patient home.

## 2017-11-04 NOTE — Addendum Note (Signed)
Addendum  created 11/04/17 2148 by Cecile Hearingurk, Conlin Brahm Edward, MD   Diagnosis association updated

## 2017-11-13 ENCOUNTER — Telehealth: Payer: Self-pay | Admitting: Nurse Practitioner

## 2017-11-13 ENCOUNTER — Telehealth: Payer: Self-pay | Admitting: Interventional Cardiology

## 2017-11-13 NOTE — Telephone Encounter (Signed)
Needs salt restriction Elevate legs Extra dose of Lasix 40 mg today See tomorrow as planned - unless if he gets worse - would advise he go back to the hospital.  Rosalio MacadamiaLori C. Kamaljit Hizer, RN, [facility]NP-[facility] [address] [place], [place]  [id number] [phone]

## 2017-11-13 NOTE — Telephone Encounter (Signed)
Cad pt to confirm appt, pt states he woke up with both feet so swollen he can't see his ankles. Denies SOB or any other symptoms-has appt with Lawson FiscalLori 11-14-17 at 11a-pls advise

## 2017-11-13 NOTE — Telephone Encounter (Signed)
New Message:     Pt c/o swelling: STAT is pt has developed SOB within 24 hours  1) How much weight have you gained and in what time span? 5 lbs in 2 days  2) If swelling, where is the swelling located? ankles  3) Are you currently taking a fluid pill? Yes  4) Are you currently SOB? No  5) Do you have a log of your daily weights (if so, list)? No  6) Have you gained 3 pounds in a day or 5 pounds in a week? Yes  7) Have you traveled recently? No   Pt's wife states he is also experiencing a bad cough that he can't seem to get rid of. Pt has an appt on 4/18
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
## 2017-11-13 NOTE — Telephone Encounter (Signed)
S/w pt stated thinks ankles are swollen due to sleeping on a chair with no elevation of legs. Swelling is down a little.  Advise to take extra dose of lasix (40 mg ), elevate legs, avoid salt. Advised if gets worse to go to ED, otherwise see pt for ov tomorrow. [place].

## 2017-11-14 ENCOUNTER — Other Ambulatory Visit: Payer: Self-pay

## 2017-11-14 ENCOUNTER — Ambulatory Visit (INDEPENDENT_AMBULATORY_CARE_PROVIDER_SITE_OTHER): Payer: BLUE CROSS/BLUE SHIELD | Admitting: Nurse Practitioner

## 2017-11-14 ENCOUNTER — Encounter: Payer: Self-pay | Admitting: Nurse Practitioner

## 2017-11-14 VITALS — BP 148/90 | HR 79 | Ht 67.0 in | Wt 229.4 lb

## 2017-11-14 DIAGNOSIS — Z951 Presence of aortocoronary bypass graft: Secondary | ICD-10-CM

## 2017-11-14 MED ORDER — TRAMADOL HCL 50 MG PO TABS: 50.0000 mg | ORAL_TABLET | Freq: Four times a day (QID) | ORAL | 0 refills | Status: DC | PRN

## 2017-11-14 NOTE — Patient Instructions (Addendum)
We will be checking the following labs today - BMET & CBC   Medication Instructions:    Continue with your current medicines. BUT  Take the Lasix and potassium for one more week and then stop  Go to Dr. Zenaida NieceVan Trigt's office to pick up RX for pain medicine    Testing/Procedures To Be Arranged:  N/A  Follow-Up:   See Dr. Anne FuSkains in 4 to 6 weeks with fasting labs    Other Special Instructions:   I will send a note to cardiac rehab.     If you need a refill on your cardiac medications before your next appointment, please call your pharmacy.   Call the [facility] office at [phone] if you have any questions, problems or concerns.

## 2017-11-15 LAB — CBC
Hematocrit: 32.8 % — ABNORMAL LOW (ref 37.5–51.0)
Hemoglobin: 10.9 g/dL — ABNORMAL LOW (ref 13.0–17.7)
MCH: 29.9 pg (ref 26.6–33.0)
MCHC: 33.2 g/dL (ref 31.5–35.7)
MCV: 90 fL (ref 79–97)
Platelets: 593 10*3/uL — ABNORMAL HIGH (ref 150–379)
RBC: 3.65 x10E6/uL — ABNORMAL LOW (ref 4.14–5.80)
RDW: 13.8 % (ref 12.3–15.4)
WBC: 9.8 10*3/uL (ref 3.4–10.8)

## 2017-11-15 LAB — BASIC METABOLIC PANEL
BUN/Creatinine Ratio: 16 (ref 9–20)
BUN: 14 mg/dL (ref 6–24)
CO2: 25 mmol/L (ref 20–29)
Calcium: 9.5 mg/dL (ref 8.7–10.2)
Chloride: 94 mmol/L — ABNORMAL LOW (ref 96–106)
Creatinine, Ser: 0.89 mg/dL (ref 0.76–1.27)
GFR calc Af Amer: 111 mL/min/{1.73_m2} (ref >59)
GFR calc non Af Amer: 96 mL/min/{1.73_m2} (ref >59)
Glucose: 170 mg/dL — ABNORMAL HIGH (ref 65–99)
Potassium: 4.3 mmol/L (ref 3.5–5.2)
Sodium: 137 mmol/L (ref 134–144)

## 2017-12-04 ENCOUNTER — Ambulatory Visit (INDEPENDENT_AMBULATORY_CARE_PROVIDER_SITE_OTHER): Payer: Self-pay | Admitting: Cardiothoracic Surgery

## 2017-12-04 ENCOUNTER — Other Ambulatory Visit: Payer: Self-pay

## 2017-12-04 ENCOUNTER — Other Ambulatory Visit: Payer: Self-pay | Admitting: Cardiothoracic Surgery

## 2017-12-04 ENCOUNTER — Ambulatory Visit
Admission: RE | Admit: 2017-12-04 | Discharge: 2017-12-04 | Disposition: A | Discharge status: Home / Self Care | Payer: BLUE CROSS/BLUE SHIELD | Source: Ambulatory Visit | Attending: Cardiothoracic Surgery | Admitting: Cardiothoracic Surgery

## 2017-12-04 ENCOUNTER — Encounter: Payer: Self-pay | Admitting: Cardiothoracic Surgery

## 2017-12-04 VITALS — BP 126/73 | HR 73 | Resp 16 | Ht 67.0 in | Wt 227.0 lb

## 2017-12-04 DIAGNOSIS — I469 Cardiac arrest, cause unspecified: Secondary | ICD-10-CM

## 2017-12-04 DIAGNOSIS — Z951 Presence of aortocoronary bypass graft: Secondary | ICD-10-CM

## 2017-12-04 DIAGNOSIS — I2510 Atherosclerotic heart disease of native coronary artery without angina pectoris: Secondary | ICD-10-CM

## 2017-12-04 NOTE — Progress Notes (Signed)
PCP is Raenette Rover, PA Referring Provider is Jake Bathe, MD  Chief Complaint  Patient presents with  . Routine Post Op    s/p EMERGENT CABG X 3.Marland KitchenMarland Kitchen3/29/19 with a CXR    HPI: Scheduled I month follow-up after emergency CABG after out-of-hospital V. fib arrest with CPR and preop balloon pump.  Patient doing well.  Walking approximately 5 minutes daily.  No recurrent symptoms of angina syncope or CHF.  Surgical incision is healing well.  Mild sternal and chest wall pain treated with tramadol.  No drainage from incisions.  No tibial edema.  Patient is set up to start outpatient cardiac rehab at [facility] soon. 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  Past Medical History:  Diagnosis Date  . Diabetes mellitus (HCC)   . Hyperlipemia   . Hypertension   . Tobacco abuse     Past Surgical History:  Procedure Laterality Date  . CORONARY ARTERY BYPASS GRAFT N/A 10/25/2017   Procedure: CORONARY ARTERY BYPASS GRAFTING (CABG) times three, WITH ENDOSCOPIC HARVESTING OF RIGHT SAPHENOUS VEIN;  Surgeon: Kerin Perna, MD;  Location: [facility] OR;  Service: Open Heart Surgery;  Laterality: N/A;  . IABP INSERTION N/A 10/25/2017   Procedure: IABP Insertion;  Surgeon: Lyn Records, MD;  [facility] INVASIVE CV LAB;  Service: Cardiovascular;  Laterality: N/A;  . LEFT HEART CATH AND CORONARY ANGIOGRAPHY N/A 10/25/2017   Procedure: LEFT HEART CATH AND CORONARY ANGIOGRAPHY;  Surgeon: Lyn Records, MD;  Location: [facility] INVASIVE CV LAB;  Service: Cardiovascular;  Laterality: N/A;  . TEE WITHOUT CARDIOVERSION N/A 10/25/2017   Procedure: TRANSESOPHAGEAL ECHOCARDIOGRAM (TEE);  Surgeon: Donata Clay, Theron Arista, MD;  Location: [facility] OR;  Service: Open Heart Surgery;  Laterality: N/A;    Family History  Family history unknown: Yes    Social History Social History   Tobacco Use  . Smoking status: Former Games developer  . Smokeless tobacco: Never Used  Substance Use Topics  . Alcohol use:  Yes  . Drug use: Yes    Types: Marijuana    Current Outpatient Medications  Medication Sig Dispense Refill  . aspirin EC 325 MG EC tablet Take 1 tablet (325 mg total) by mouth daily. 30 tablet 0  . atorvastatin (LIPITOR) 40 MG tablet Take 40 mg by mouth at bedtime.    . enalapril (VASOTEC) 2.5 MG tablet Take 1 tablet (2.5 mg total) by mouth at bedtime. 30 tablet 1  . levothyroxine (SYNTHROID, LEVOTHROID) 25 MCG tablet Take 25 mcg by mouth daily.    . metFORMIN (GLUCOPHAGE) 500 MG tablet Take 500 mg by mouth 2 (two) times daily with a meal.    . metoprolol tartrate (LOPRESSOR) 25 MG tablet Take 0.5 tablets (12.5 mg total) by mouth 2 (two) times daily. 30 tablet 1  . Omega-3 Fatty Acids (FISH OIL TRIPLE STRENGTH PO) Take 1 capsule by mouth 2 (two) times daily.    . traMADol (ULTRAM) 50 MG tablet Take 1 tablet (50 mg total) by mouth every 6 (six) hours as needed for severe pain. 30 tablet 0   No current facility-administered medications for this visit.     No Known Allergies  Review of Systems  Appetite and weight improved Problems with insomnia No shortness of breath No headache or syncope  BP 126/73 (BP Location: Left Arm, Patient Position: Sitting, Cuff Size: Large)   Pulse 73   Resp 16   Ht  (1.702 m)   Wt 227 lb (  103 kg)   SpO2 98% Comment: ON RA  BMI 35.55 kg/m  Physical Exam      Exam    General- alert and comfortable    Neck- no JVD, no cervical adenopathy palpable, no carotid bruit   Lungs- clear without rales, wheezes.  Sternal incision well-healed.   Cor- regular rate and rhythm, no murmur , gallop   Abdomen- soft, non-tender   Extremities - warm, non-tender, minimal edema   Neuro- oriented, appropriate, no focal weakness   Diagnostic Tests: Chest x-ray.  Sternal wires intact  Impression: Doing well 1 month after emergent CABG Patient can drive and lift up to 20 pounds Ready to start cardiac rehab Continue current medications  Plan: Return for  follow-up in 4-6 weeks to assess progress and discuss full-time return to work  Mikey Bussing, MD [facility] [phone]

## 2018-01-03 ENCOUNTER — Other Ambulatory Visit: Payer: Self-pay | Admitting: Nurse Practitioner

## 2018-01-03 MED ORDER — METOPROLOL TARTRATE 25 MG PO TABS: 12.5000 mg | ORAL_TABLET | Freq: Two times a day (BID) | ORAL | 0 refills | Status: DC

## 2018-01-03 NOTE — Telephone Encounter (Signed)
Pt's medication was sent to pt's pharmacy as requested. Confirmation received.  °

## 2018-01-07 ENCOUNTER — Encounter (INDEPENDENT_AMBULATORY_CARE_PROVIDER_SITE_OTHER): Payer: Self-pay

## 2018-01-07 ENCOUNTER — Ambulatory Visit (INDEPENDENT_AMBULATORY_CARE_PROVIDER_SITE_OTHER): Payer: BLUE CROSS/BLUE SHIELD | Admitting: Cardiology

## 2018-01-07 ENCOUNTER — Encounter: Payer: Self-pay | Admitting: Cardiology

## 2018-01-07 VITALS — BP 144/88 | HR 72 | Ht 67.0 in | Wt 243.0 lb

## 2018-01-07 DIAGNOSIS — I469 Cardiac arrest, cause unspecified: Secondary | ICD-10-CM | POA: Diagnosis not present

## 2018-01-07 DIAGNOSIS — Z951 Presence of aortocoronary bypass graft: Secondary | ICD-10-CM | POA: Diagnosis not present

## 2018-01-07 DIAGNOSIS — I4901 Ventricular fibrillation: Secondary | ICD-10-CM

## 2018-01-07 MED ORDER — ENALAPRIL MALEATE 2.5 MG PO TABS: 2.5000 mg | ORAL_TABLET | Freq: Every day | ORAL | 3 refills | Status: DC

## 2018-01-07 MED ORDER — METOPROLOL TARTRATE 25 MG PO TABS: 12.5000 mg | ORAL_TABLET | Freq: Two times a day (BID) | ORAL | 3 refills | Status: DC

## 2018-01-07 MED ORDER — ATORVASTATIN CALCIUM 40 MG PO TABS: 40.0000 mg | ORAL_TABLET | Freq: Every day | ORAL | 3 refills | Status: DC

## 2018-01-07 NOTE — Patient Instructions (Signed)
Medication Instructions:  The current medical regimen is effective;  continue present plan and medications.  Follow-Up: Follow up in 3 months with Norma FredricksonLori Gerhardt, NP and Dr Anne FuSkains in 6 months.  If you need a refill on your cardiac medications before your next appointment, please call your pharmacy.  Thank you for choosing [facility]!!
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
## 2018-01-08 ENCOUNTER — Encounter: Payer: Self-pay | Admitting: Cardiothoracic Surgery

## 2018-01-29 ENCOUNTER — Encounter: Payer: Self-pay | Admitting: Cardiothoracic Surgery

## 2018-01-31 ENCOUNTER — Ambulatory Visit (INDEPENDENT_AMBULATORY_CARE_PROVIDER_SITE_OTHER): Payer: Self-pay | Admitting: Cardiothoracic Surgery

## 2018-01-31 ENCOUNTER — Other Ambulatory Visit: Payer: Self-pay

## 2018-01-31 ENCOUNTER — Encounter: Payer: Self-pay | Admitting: Cardiothoracic Surgery

## 2018-01-31 VITALS — BP 129/82 | HR 81 | Resp 18 | Ht 67.0 in | Wt 238.0 lb

## 2018-01-31 DIAGNOSIS — Z951 Presence of aortocoronary bypass graft: Secondary | ICD-10-CM

## 2018-01-31 NOTE — Progress Notes (Signed)
PCP is Raenette RoverStein, William, PA Referring Provider is Jake BatheSkains, Mark C, MD  Chief Complaint  Patient presents with  . Routine Post Op    s/p CABG x3 10/25/17    HPI: Final postop visit 3 months after emergency CABG after patient had out of hospital V. fib arrest, preop balloon pump.  EF at conclusion of CABG procedure was close to normal. Patient has had an uneventful recovery.  He has stopped smoking and with that has gained 15-20 pounds.  He denies chest pain or symptoms of CHF.  Surgical incision is well-healed.  He has no edema.  Chest x-ray today is clear.  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  He may now lift more than 20-25 pounds.  He should continue his current medications.  The importance of heart healthy diet and heart  healthy lifestyle and smoking cessation were emphasized to the patient.  Past Medical History:  Diagnosis Date  . Diabetes mellitus (HCC)   . Hyperlipemia   . Hypertension   . Tobacco abuse     Past Surgical History:  Procedure Laterality Date  . CORONARY ARTERY BYPASS GRAFT N/A 10/25/2017   Procedure: CORONARY ARTERY BYPASS GRAFTING (CABG) times three, WITH ENDOSCOPIC HARVESTING OF RIGHT SAPHENOUS VEIN;  Surgeon: Kerin PernaVan Trigt, Peter, MD;  [facility];  Service: Open Heart Surgery;  Laterality: N/A;  . IABP INSERTION N/A 10/25/2017   Procedure: IABP Insertion;  Surgeon: Lyn RecordsSmith, Henry W, MD;  Location: [facility] IN[facility]AS[facility] LAB;  Service: Cardiovascular;  Laterality: N/A;  . LEFT HEART CATH AND CORONARY ANGIOGRAPHY N/A 10/25/2017   Procedure: LEFT HEART CATH AND CORONARY ANGIOGRAPHY;  Surgeon: Lyn RecordsSmith, Henry W, MD;  Location: [facility] CV LAB;  Service: Cardiovascular;  Laterality: N/A;  . TEE WITHOUT CARDIOVERSION N/A 10/25/2017   Procedure: TRANSESOPHAGEAL ECHOCARDIOGRAM (TEE);  Surgeon: Donata ClayVan Trigt, Theron AristaPeter, MD;  [facility];  Service: Open Heart Surgery;  Laterality: N/A;    Family History  Family history unknown: Yes    Social History Social History    Tobacco Use  . Smoking status: Former Games developermoker  . Smokeless tobacco: Never Used  Substance Use Topics  . Alcohol use: Yes  . Drug use: Yes    Types: Marijuana    Current Outpatient Medications  Medication Sig Dispense Refill  . aspirin EC 325 MG EC tablet Take 1 tablet (325 mg total) by mouth daily. 30 tablet 0  . atorvastatin (LIPITOR) 40 MG tablet Take 1 tablet (40 mg total) by mouth at bedtime. 90 tablet 3  . enalapril (VASOTEC) 2.5 MG tablet Take 1 tablet (2.5 mg total) by mouth at bedtime. 90 tablet 3  . levothyroxine (SYNTHROID, LEVOTHROID) 25 MCG tablet Take 25 mcg by mouth daily.    . metFORMIN (GLUCOPHAGE) 500 MG tablet Take 500 mg by mouth 2 (two) times daily with a meal.    . metoprolol tartrate (LOPRESSOR) 25 MG tablet Take 0.5 tablets (12.5 mg total) by mouth 2 (two) times daily. 45 tablet 3  . Omega-3 Fatty Acids (FISH OIL TRIPLE STRENGTH PO) Take 1 capsule by mouth 2 (two) times daily.    . traMADol (ULTRAM) 50 MG tablet Take 1 tablet (50 mg total) by mouth every 6 (six) hours as needed for severe pain. 30 tablet 0   No current facility-administered medications for this visit.     No Known Allergies  Review of Systems   Weight gain of 15 to 20 pounds No fever No clicking or popping  sensation of sternal incision No edema Walking daily  BP 129/82 (BP Location: Left Arm, Patient Position: Sitting, Cuff Size: Normal)   Pulse 81   Resp 18   Ht 5\' 7"  (1.702 m)   Wt 238 lb (108 kg)   SpO2 93% Comment: RA  BMI 37.28 kg/m  Physical Exam      Exam    General- alert and comfortable    Neck- no JVD, no cervical adenopathy palpable, no carotid bruit   Lungs- clear without rales, wheezes   Cor- regular rate and rhythm, no murmur , gallop   Abdomen- soft, non-tender   Extremities - warm, non-tender, minimal edema   Neuro- oriented, appropriate, no focal weakness   Diagnostic Tests: Chest x-ray clear sternal wires intact Cardiac silhouette  stable  Impression: Excellent recovery after acute MI with V. fib arrest and CABG. Cardiac function is stable. Neurologic status is normal Patient ready to return to work and normal activity levels. No restrictions  Plan: Continue current medications and follow-up with cardiology and medical physicians.  Return as needed.   Mikey Bussing, MD [facility] [phone]

## 2018-04-04 ENCOUNTER — Encounter: Payer: Self-pay | Admitting: Nurse Practitioner

## 2018-04-21 ENCOUNTER — Encounter: Payer: Self-pay | Admitting: Medical

## 2018-04-21 ENCOUNTER — Ambulatory Visit (INDEPENDENT_AMBULATORY_CARE_PROVIDER_SITE_OTHER): Payer: BLUE CROSS/BLUE SHIELD | Admitting: Medical

## 2018-04-21 ENCOUNTER — Encounter (INDEPENDENT_AMBULATORY_CARE_PROVIDER_SITE_OTHER): Payer: Self-pay

## 2018-04-21 VITALS — BP 162/90 | HR 72 | Ht 67.0 in | Wt 236.8 lb

## 2018-04-21 DIAGNOSIS — I4901 Ventricular fibrillation: Secondary | ICD-10-CM

## 2018-04-21 DIAGNOSIS — I10 Essential (primary) hypertension: Secondary | ICD-10-CM | POA: Diagnosis not present

## 2018-04-21 DIAGNOSIS — E785 Hyperlipidemia, unspecified: Secondary | ICD-10-CM

## 2018-04-21 DIAGNOSIS — Z720 Tobacco use: Secondary | ICD-10-CM

## 2018-04-21 DIAGNOSIS — I469 Cardiac arrest, cause unspecified: Secondary | ICD-10-CM | POA: Diagnosis not present

## 2018-04-21 DIAGNOSIS — Z951 Presence of aortocoronary bypass graft: Secondary | ICD-10-CM

## 2018-04-21 DIAGNOSIS — E119 Type 2 diabetes mellitus without complications: Secondary | ICD-10-CM

## 2018-04-21 MED ORDER — ASPIRIN EC 81 MG PO TBEC: 81.0000 mg | DELAYED_RELEASE_TABLET | Freq: Every day | ORAL | 3 refills | Status: DC

## 2018-04-21 MED ORDER — ENALAPRIL MALEATE 5 MG PO TABS: 5.0000 mg | ORAL_TABLET | Freq: Every day | ORAL | 6 refills | Status: DC

## 2018-04-21 NOTE — Progress Notes (Signed)
Cardiology Office Note   Date:  04/21/2018   ID:  Nichole Keltner, DOB 1960-11-19, MRN [id number]  PCP:  Abner Greenspan, MD  Cardiologist:  Donato Schultz, MD   Chief Complaint  Patient presents with  . Follow-up    of CAD and HTN      History of Present Illness: Yosef Krogh is a 57 y.o. male who presents for 3 month follow-up. He has a PMH of VF arrest requiring multiple shocks, and emergency CABG (09/2017) with preserved EF, HTN, HLD, and DM type 2. He is a former smoker and has been tobacco free since his cardiac arrest in March.  His post op course has been complicated by edema for which he is taking lasix.   He was last seen by Dr. Anne Fu 12/2017 and was thought to be doing well. He reported some weight gain after stopping smoking and was up 10-15lbs from 09/2017.   He presents today for routine follow-up.  He was cleared to return to work 12/2017. He has done well since he was last seen. He denies chest pain at rest or with exertion, SOB, DOE, dizziness, lightheadedness, syncope, or problems with bleeding. He does not monitor his blood pressure at home. 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 Unfortunately he is back to smoking ~1 pack per 3-4 days. He reported that when he was unable to drive it was easier for him to avoid them but he now has access to them and has fallen back into old habits.    Past Medical History:  Diagnosis Date  . Diabetes mellitus (HCC)   . Hyperlipemia   . Hypertension   . Tobacco abuse     Past Surgical History:  Procedure Laterality Date  . CORONARY ARTERY BYPASS GRAFT N/A 10/25/2017   Procedure: CORONARY ARTERY BYPASS GRAFTING (CABG) times three, WITH ENDOSCOPIC HARVESTING OF RIGHT SAPHENOUS VEIN;  Surgeon: Kerin Perna, MD;  [facility] OR;  Service: Open Heart Surgery;  Laterality: N/A;  . IABP INSERTION N/A 10/25/2017   Procedure: IABP Insertion;  Surgeon: Lyn Records, MD;  [facility];  Service: Cardiovascular;  Laterality: N/A;  . LEFT HEART CATH AND CORONARY ANGIOGRAPHY N/A 10/25/2017   Procedure: LEFT HEART CATH AND CORONARY ANGIOGRAPHY;  Surgeon: Lyn Records, MD;  [facility];  Service: Cardiovascular;  Laterality: N/A;  . TEE WITHOUT CARDIOVERSION N/A 10/25/2017   Procedure: TRANSESOPHAGEAL ECHOCARDIOGRAM (TEE);  Surgeon: Donata Clay, Theron Arista, MD;  Location: [facility] OR;  Service: Open Heart Surgery;  Laterality: N/A;     Current Outpatient Medications  Medication Sig Dispense Refill  . atorvastatin (LIPITOR) 40 MG tablet Take 1 tablet (40 mg total) by mouth at bedtime. 90 tablet 3  . levothyroxine (SYNTHROID, LEVOTHROID) 25 MCG tablet Take 25 mcg by mouth daily.    . metFORMIN (GLUCOPHAGE) 500 MG tablet Take 500 mg by mouth 2 (two) times daily with a meal.    . metoprolol tartrate (LOPRESSOR) 25 MG tablet Take 0.5 tablets (12.5 mg total) by mouth 2 (two) times daily. 45 tablet 3  . Omega-3 Fatty Acids (FISH OIL TRIPLE STRENGTH PO) Take 1 capsule by mouth 2 (two) times daily.    . traMADol (ULTRAM) 50 MG tablet Take 1 tablet (50 mg total) by mouth every 6 (six) hours as needed for severe pain. 30 tablet 0  . aspirin EC 81 MG tablet  Take 1 tablet (81 mg total) by mouth daily. 90 tablet 3  . enalapril (VASOTEC) 5 MG tablet Take 1 tablet (5 mg total) by mouth daily. 30 tablet 6   No current facility-administered medications for this visit.     Allergies:   Patient has no known allergies.    Social History:  The patient  reports that he has quit smoking. He has never used smokeless tobacco. He reports that he drinks alcohol. He reports that he has current or past drug history. Drug: Marijuana.   Family History:  Family history is unknown by patient.    ROS:  Please see the history of present illness.   Otherwise, review of systems are positive for none.   All other systems are reviewed and negative.    PHYSICAL  EXAM: VS:  BP (!) 162/90 (BP Location: Left Arm, Patient Position: Sitting, Cuff Size: Normal)   Pulse 72   Ht 5\' 7"  (1.702 m)   Wt 236 lb 12.8 oz (107.4 kg)   SpO2 96% Comment: at rest  BMI 37.09 kg/m  , BMI Body mass index is 37.09 kg/m. GEN: Well nourished, well developed, in no acute distress HEENT: sclera anicteric Neck: no JVD, carotid bruits, or masses Cardiac: RRR; no murmurs, rubs, or gallops, no edema. Mid-sternal scar is well healed Respiratory:  clear to auscultation bilaterally, normal work of breathing GI: soft, obese, nontender, nondistended, + BS MS: no deformity or atrophy Skin: warm and dry, no rash Neuro:  Strength and sensation are intact Psych: euthymic mood, full affect   EKG:  EKG is not ordered today.  Recent Labs: 10/26/2017: Magnesium 1.9 11/01/2017: ALT 40 11/14/2017: BUN 14; Creatinine, Ser 0.89; Hemoglobin 10.9; Platelets 593; Potassium 4.3; Sodium 137    Lipid Panel    Component Value Date/Time   CHOL 125 10/25/2017 1529   TRIG 40 10/25/2017 1529   HDL 42 10/25/2017 1529   CHOLHDL 3.0 10/25/2017 1529   VLDL 8 10/25/2017 1529   LDLCALC 75 10/25/2017 1529      Wt Readings from Last 3 Encounters:  04/21/18 236 lb 12.8 oz (107.4 kg)  01/31/18 238 lb (108 kg)  01/07/18 243 lb (110.2 kg)      Other studies Reviewed: Additional studies/ records that were reviewed today include:   Echocardiogram bubble study 10/2017: Study Conclusions  - Impressions: Limited study. LV EF appears normal With definity   there is no right to left shunt seen on bubble study.  Impressions:  - Limited study. LV EF appears normal With definity there is no   right to left shunt seen on bubble study.  Echocardiogram 10/2017 Study Conclusions  - Left ventricle: The cavity size was normal. Wall thickness was   normal. Systolic function was vigorous. The estimated ejection   fraction was in the range of 65% to 70%. - Atrial septum: Septum seems mobile with  probable large appearing   PFO.   ASSESSMENT AND PLAN:   1. CAD s/p emergent CABG after VF arrest: no anginal complaints. He returned to work without difficulty.  - Will decrease aspirin to 81mg  daily at this time - Continue statin  2. HTN: BP elevated at visit today - Will increase enalapril to 5mg  daily - Continue metoprolol  3. DM type 2: Last A1C 6.2 09/2017; at goal of <7 - Continue metformin  4. HLD: patient to have lab work done on Friday in anticipation of upcoming PCP visit.  - Patient instructed to have labs sent to  our office for review - Continue statin  5. Tobacco abuse: he is back to smoking 1 pack per 3-4 days.  The patient was counseled on tobacco cessation today for 3-5  minutes.  Counseling included reviewing the risks of smoking tobacco products, how it impacts the patient's current medical diagnoses and different strategies for quitting.  Pharmacotherapy to aid in tobacco cessation was not prescribed today. - Continue to encourage smoking cessation     Current medicines are reviewed at length with the patient today.  The patient does not have concerns regarding medicines.  The following changes have been made:  Aspirin was decreased to 81mg  daily. Enalapril was increased to 5mg  daily  Labs/ tests ordered today include: none. Patient instructed to have labs obtained this Friday sent to our office for review.  No orders of the defined types were placed in this encounter.    Disposition:   FU with Dr. Anne Fu in 6 months  Signed, Beatriz Stallion, PA-C  04/21/2018 10:49 AM

## 2018-04-21 NOTE — Patient Instructions (Addendum)
We will not be checking the following labs today - please have your primary care provider send us a copy of your labs on Friday for monitoring of your kidney function and blood counts   Medication Instructions:    Your enalapril has been increased to 5mg  daily  Please decrease your aspirin dose to 81mg  daily  Continue to eat a heart healthy diet and maintain a heart healthy lifestyle     Testing/Procedures To Be Arranged:  N/A  Follow-Up:   See Dr. Anne FuSkains in 6 months    Other Special Instructions:   N/A    If you need a refill on your cardiac medications before your next appointment, please call your pharmacy.   Call the [facility] office at [phone] if you have any questions, problems or concerns.

## 2018-08-13 ENCOUNTER — Other Ambulatory Visit: Payer: Self-pay | Admitting: Cardiology

## 2018-12-10 ENCOUNTER — Telehealth: Payer: Self-pay | Admitting: Cardiology

## 2018-12-10 NOTE — Telephone Encounter (Signed)
New message     Virtual video visit scheduled for 12-19-18.  Patient gave consent for video visit and will have bp reading for nurse.   YOUR CARDIOLOGY TEAM HAS ARRANGED FOR AN E-VISIT FOR YOUR APPOINTMENT - PLEASE REVIEW IMPORTANT INFORMATION BELOW SEVERAL DAYS PRIOR TO YOUR APPOINTMENT  Due to the recent COVID-19 pandemic, we are transitioning in-person office visits to tele-medicine visits in an effort to decrease unnecessary exposure to our patients, their families, and staff. These visits are billed to your insurance just like a normal visit is. We also encourage you to sign up for MyChart if you have not already done so. You will need a smartphone if possible. For patients that do not have this, we can still complete the visit using a regular telephone but do prefer a smartphone to enable video when possible. You may have a family member that lives with you that can help. If possible, we also ask that you have a blood pressure cuff and scale at home to measure your blood pressure, heart rate and weight prior to your scheduled appointment. Patients with clinical needs that need an in-person evaluation and testing will still be able to come to the office if absolutely necessary. If you have any questions, feel free to call our office.     YOUR PROVIDER WILL BE USING THE FOLLOWING PLATFORM TO COMPLETE YOUR VISIT: Doximity   IF USING MYCHART - How to Download the MyChart App to Your SmartPhone   - If Apple, go to [employer] and type in MyChart in the search bar and download the app. If Android, ask patient to go to [employer] and type in Jal in the search bar and download the app. The app is free but as with any other app downloads, your phone may require you to verify saved payment information or Apple/Android password.  - You will need to then log into the app with your MyChart username and password, and select [facility] as your healthcare provider to link the account.  - When it  is time for your visit, go to the MyChart app, find appointments, and click Begin Video Visit. Be sure to Select Allow for your device to access the Microphone and Camera for your visit. You will then be connected, and your provider will be with you shortly.  **If you have any issues connecting or need assistance, please contact MyChart service desk [phone])**  **If using a computer, in order to ensure the best quality for your visit, you will need to use either of the following Internet Browsers: Agricultural consultant or Microsoft Edge**   IF USING DOXIMITY or [email or web address] - The staff will give you instructions on receiving your link to join the meeting the day of your visit.      2-3 DAYS BEFORE YOUR APPOINTMENT  You will receive a telephone call from one of our HeartCare team members - your caller ID may say "Unknown caller." If this is a video visit, we will walk you through how to get the video launched on your phone. We will remind you check your blood pressure, heart rate and weight prior to your scheduled appointment. If you have an Apple Watch or Kardia, please upload any pertinent ECG strips the day before or morning of your appointment to MyChart. Our staff will also make sure you have reviewed the consent and agree to move forward with your scheduled tele-health visit.     THE DAY OF YOUR APPOINTMENT  Approximately 15 minutes prior to your scheduled appointment, you will receive a telephone call from one of [facility] team - your caller ID may say "Unknown caller."  Our staff will confirm medications, vital signs for the day and any symptoms you may be experiencing. Please have this information available prior to the time of visit start. It may also be helpful for you to have a pad of paper and pen handy for any instructions given during your visit. They will also walk you through joining the smartphone meeting if this is a video visit.    CONSENT FOR TELE-HEALTH VISIT -  PLEASE REVIEW  I hereby voluntarily request, consent and authorize [facility] and its employed or contracted physicians, physician assistants, nurse practitioners or other licensed health care professionals (the Practitioner), to provide me with telemedicine health care services (the Services") as deemed necessary by the treating Practitioner. I acknowledge and consent to receive the Services by the Practitioner via telemedicine. I understand that the telemedicine visit will involve communicating with the Practitioner through live audiovisual communication technology and the disclosure of certain medical information by electronic transmission. I acknowledge that I have been given the opportunity to request an in-person assessment or other available alternative prior to the telemedicine visit and am voluntarily participating in the telemedicine visit.  I understand that I have the right to withhold or withdraw my consent to the use of telemedicine in the course of my care at any time, without affecting my right to future care or treatment, and that the Practitioner or I may terminate the telemedicine visit at any time. I understand that I have the right to inspect all information obtained and/or recorded in the course of the telemedicine visit and may receive copies of available information for a reasonable fee.  I understand that some of the potential risks of receiving the Services via telemedicine include:   Delay or interruption in medical evaluation due to technological equipment failure or disruption;  Information transmitted may not be sufficient (e.g. poor resolution of images) to allow for appropriate medical decision making by the Practitioner; and/or   In rare instances, security protocols could fail, causing a breach of personal health information.  Furthermore, I acknowledge that it is my responsibility to provide information about my medical history, conditions and care that is complete  and accurate to the best of my ability. I acknowledge that Practitioner's advice, recommendations, and/or decision may be based on factors not within their control, such as incomplete or inaccurate data provided by me or distortions of diagnostic images or specimens that may result from electronic transmissions. I understand that the practice of medicine is not an exact science and that Practitioner makes no warranties or guarantees regarding treatment outcomes. I acknowledge that I will receive a copy of this consent concurrently upon execution via email to the email address I last provided but may also request a printed copy by calling the office of [facility].    I understand that my insurance will be billed for this visit.   I have read or had this consent read to me.  I understand the contents of this consent, which adequately explains the benefits and risks of the Services being provided via telemedicine.   I have been provided ample opportunity to ask questions regarding this consent and the Services and have had my questions answered to my satisfaction.  I give my informed consent for the services to be provided through the use of telemedicine in my medical  care  By participating in this telemedicine visit I agree to the above.

## 2018-12-15 NOTE — Progress Notes (Signed)
Virtual Visit via Video Note   This visit type was conducted due to national recommendations for restrictions regarding the COVID-19 Pandemic (e.g. social distancing) in an effort to limit this patient's exposure and mitigate transmission in our community.  Due to his co-morbid illnesses, this patient is at least at moderate risk for complications without adequate follow up.  This format is felt to be most appropriate for this patient at this time.  All issues noted in this document were discussed and addressed.  A limited physical exam was performed with this format.  Please refer to the patient's chart for his consent to telehealth for [facility].   Date:  12/19/2018   ID:  Eddie Black, DOB [birth year], MRN [id number]  Patient Location: Home Provider Location: Home  PCP:  Abner Greenspan, MD  Cardiologist:  Donato Schultz, MD   Evaluation Performed:  Follow-Up Visit  Chief Complaint:  Follow up for CAD and HTN, seen for Dr. Anne Fu  History of Present Illness:    Eddie Black is a 58 y.o. 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    He was last seen in follow up 04/21/2018 and was noted to be doing relatively well from a cardiac perspective. 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 He had stopped smoking for a brief period in the post-op setting however was noted to be smoking about 1ppd again at last office visit.   Today he states that he is doing well from a cardiac perspective.  Still has some sternal discomfort with coughing in which we discussed bracing techniques.  He reports that he has been completing yard work and other activities without anginal symptoms.  He denies shortness of breath, palpitations, LE swelling, orthopnea, PND, dizziness or syncope.  He states he continues  to smoke about 1 pack/day and is currently not ready to quit.  He followed with his PCP to 2020 for lab work which looks good.  His hemoglobin A1c continues to be mildly elevated in which they are working on.  His BP is elevated today.  He reports he only spot checks it occasionally.  He states it typically runs in the 140 range.  This was however before his a.m. medications.  Given his recent surgery, we discussed increasing his enalapril and checking his BP on a more regular basis.  He agrees.  Otherwise he is with no specific complaints.   The patient does not have symptoms concerning for COVID-19 infection (fever, chills, cough, or new shortness of breath).   Past Medical History:  Diagnosis Date  . Diabetes mellitus (HCC)   . Hyperlipemia   . Hypertension   . Tobacco abuse    Past Surgical History:  Procedure Laterality Date  . CORONARY ARTERY BYPASS GRAFT N/A 10/25/2017   Procedure: CORONARY ARTERY BYPASS GRAFTING (CABG) times three, WITH ENDOSCOPIC HARVESTING OF RIGHT SAPHENOUS VEIN;  Surgeon: Kerin Perna, MD;  Location: [facility] OR;  Service: Open Heart Surgery;  Laterality: N/A;  . IABP INSERTION N/A 10/25/2017   Procedure: IABP Insertion;  Surgeon: Lyn Records, MD;  Location: [facility] INVASIVE CV LAB;  Service: Cardiovascular;  Laterality: N/A;  . LEFT HEART CATH AND CORONARY ANGIOGRAPHY N/A 10/25/2017   Procedure: LEFT HEART CATH AND CORONARY ANGIOGRAPHY;  Surgeon: Lyn Records,  MD;  Location: [facility] INVASIVE CV LAB;  Service: Cardiovascular;  Laterality: N/A;  . TEE WITHOUT CARDIOVERSION N/A 10/25/2017   Procedure: TRANSESOPHAGEAL ECHOCARDIOGRAM (TEE);  Surgeon: Donata ClayVan Trigt, Theron AristaPeter, MD;  [facility];  Service: Open Heart Surgery;  Laterality: N/A;     Current Meds  Medication Sig  . aspirin EC 81 MG tablet Take 1 tablet (81 mg total) by mouth daily.  Marland Kitchen. atorvastatin (LIPITOR) 40 MG tablet Take 1 tablet (40 mg total) by mouth at bedtime.  Marland Kitchen. buPROPion (WELLBUTRIN SR) 150 MG 12 hr tablet  Take 150 mg by mouth 2 (two) times daily.  Marland Kitchen. JARDIANCE 25 MG TABS tablet Take 25 mg by mouth daily.  Marland Kitchen. levothyroxine (SYNTHROID, LEVOTHROID) 25 MCG tablet Take 25 mcg by mouth daily.  . metFORMIN (GLUCOPHAGE) 500 MG tablet Take 500 mg by mouth 2 (two) times daily with a meal.  . metoprolol tartrate (LOPRESSOR) 25 MG tablet TAKE 1/2 (ONE-HALF) TABLET BY MOUTH TWICE DAILY  . Omega-3 Fatty Acids (FISH OIL TRIPLE STRENGTH PO) Take 1 capsule by mouth 2 (two) times daily.  . [DISCONTINUED] enalapril (VASOTEC) 2.5 MG tablet Take 2.5 mg by mouth at bedtime.     Allergies:   Patient has no known allergies.   Social History   Tobacco Use  . Smoking status: Former Games developermoker  . Smokeless tobacco: Never Used  Substance Use Topics  . Alcohol use: Yes  . Drug use: Yes    Types: Marijuana    Family Hx: The patient's Family history is unknown by patient.  ROS:   Please see the history of present illness.     All other systems reviewed and are negative.  Prior CV studies:   The following studies were reviewed today:  Echocardiogram bubble study 10/2017: Study Conclusions  - Impressions: Limited study. LV EF appears normal With definity there is no right to left shunt seen on bubble study.  Impressions:  - Limited study. LV EF appears normal With definity there is no right to left shunt seen on bubble study.  Echocardiogram 10/2017 Study Conclusions  - Left ventricle: The cavity size was normal. Wall thickness was normal. Systolic function was vigorous. The estimated ejection fraction was in the range of 65% to 70%. - Atrial septum: Septum seems mobile with probable large appearing PFO.  Labs/Other Tests and Data Reviewed:    EKG:  No ECG reviewed.  Recent Labs: No results found for requested labs within last 8760 hours.   Recent Lipid Panel Lab Results  Component Value Date/Time   CHOL 125 10/25/2017 03:29 PM   TRIG 40 10/25/2017 03:29 PM   HDL 42 10/25/2017  03:29 PM   CHOLHDL 3.0 10/25/2017 03:29 PM   LDLCALC 75 10/25/2017 03:29 PM    Wt Readings from Last 3 Encounters:  12/19/18 215 lb (97.5 kg)  04/21/18 236 lb 12.8 oz (107.4 kg)  01/31/18 238 lb (108 kg)     Objective:    Vital Signs:  BP (!) 141/95   Pulse 63   Ht 5\' 7"  (1.702 m)   Wt 215 lb (97.5 kg)   BMI 33.67 kg/m    VITAL SIGNS:  reviewed GEN:  no acute distress EYES:  sclerae anicteric, EOMI - Extraocular Movements Intact RESPIRATORY:  normal respiratory effort, symmetric expansion SKIN:  no rash, lesions or ulcers. MUSCULOSKELETAL:  no obvious deformities. NEURO:  alert and oriented x 3, no obvious focal deficit PSYCH:  normal affect  ASSESSMENT & PLAN:    1.  CAD s/p emergent CABG after VF arrest:  -No anginal complaints -Has been completing yard work without difficulty  -Continue ASA 81, atorvastatin 40, metoprolol 12.5 -Will increase enalapril given elevated BP  2. HTN:  -Elevated, 141/95, this was his recheck.  Initially registered at 154/104 -Enalapril was increased at last office visit to  daily  -Increase enalapril to 10 mg daily  3. DM type 2:  -Last A1C 6.2 09/2017; at goal of <7 -Continue metformin per PCP -Consider the addition of Januvia, SLGT2  4. HLD:  -Last LDL, 55 on 09/02/2018 -Continue statin  5. Tobacco abuse: -Reported return to smoking at last office visit>>> continues to smoke 1 pack/day -Cessation strongly encouraged as this is one of his major risk factors for continued progression of CAD  -Pharmacotherapy declined   COVID-19 Education: The signs and symptoms of COVID-19 were discussed with the patient and how to seek care for testing (follow up with PCP or arrange E-visit). The importance of social distancing was discussed today.  Time:   Today, I have spent 20  minutes with the patient with telehealth technology discussing the above problems.     Medication Adjustments/Labs and Tests Ordered: Current medicines are  reviewed at length with the patient today.  Concerns regarding medicines are outlined above.   Tests Ordered: No orders of the defined types were placed in this encounter.   Medication Changes: Meds ordered this encounter  Medications  . enalapril (VASOTEC) 10 MG tablet    Sig: Take 1 tablet (10 mg total) by mouth daily.    Dispense:  90 tablet    Refill:  3    Disposition:  Follow up Dr. Anne Fu in 6 months  Signed, Georgie Chard, NP  12/19/2018 8:48 AM    [facility]

## 2018-12-18 ENCOUNTER — Telehealth: Payer: Self-pay | Admitting: Cardiology

## 2018-12-18 NOTE — Telephone Encounter (Signed)
Virtual Visit Pre-Appointment Phone Call  "(Name), I am calling you today to discuss your upcoming appointment. We are currently trying to limit exposure to the virus that causes COVID-19 by seeing patients at home rather than in the office."  1. "What is the BEST phone number to call the day of the visit?" - include this in appointment notes  2. "Do you have or have access to (through a family member/friend) a smartphone with video capability that we can use for your visit?" a. If yes - list this number in appt notes as "cell" (if different from BEST phone #) and list the appointment type as a VIDEO visit in appointment notes b. If no - list the appointment type as a PHONE visit in appointment notes  3. Confirm consent - "In the setting of the current Covid19 crisis, you are scheduled for a (phone or video) visit with your provider on (date) at (time).  Just as we do with many in-office visits, in order for you to participate in this visit, we must obtain consent.  If you'd like, I can send this to your mychart (if signed up) or email for you to review.  Otherwise, I can obtain your verbal consent now.  All virtual visits are billed to your insurance company just like a normal visit would be.  By agreeing to a virtual visit, we'd like you to understand that the technology does not allow for your provider to perform an examination, and thus may limit your provider's ability to fully assess your condition. If your provider identifies any concerns that need to be evaluated in person, we will make arrangements to do so.  Finally, though the technology is pretty good, we cannot assure that it will always work on either your or our end, and in the setting of a video visit, we may have to convert it to a phone-only visit.  In either situation, we cannot ensure that we have a secure connection.  Are you willing to proceed?" STAFF: Did the patient verbally acknowledge consent to telehealth visit? Document  YES/NO here: YES  PT DECLINED MYCHART BUT GAVE VERBAL  4. Advise patient to be prepared - "Two hours prior to your appointment, go ahead and check your blood pressure, pulse, oxygen saturation, and your weight (if you have the equipment to check those) and write them all down. When your visit starts, your provider will ask you for this information. If you have an Apple Watch or Kardia device, please plan to have heart rate information ready on the day of your appointment. Please have a pen and paper handy nearby the day of the visit as well."  5. Give patient instructions for MyChart download to smartphone OR [email or web address] as below if video visit (depending on what platform provider is using)  6. Inform patient they will receive a phone call 15 minutes prior to their appointment time (may be from unknown caller ID) so they should be prepared to answer    TELEPHONE CALL NOTE  Eddie Black has been deemed a candidate for a follow-up tele-health visit to limit community exposure during the Covid-19 pandemic. I spoke with the patient via phone to ensure availability of phone/video source, confirm preferred email & phone number, and discuss instructions and expectations.  I reminded Eddie Alexanders to be prepared with any vital sign and/or heart rhythm information that could potentially be obtained via home monitoring, at the time of his visit. I reminded Eddie Black to expect a  phone call prior to his visit.  Eddie CousinWitty, Eddie Black, RMA 12/18/2018 1:25 PM   INSTRUCTIONS FOR DOWNLOADING THE MYCHART APP TO SMARTPHONE  - The patient must first make sure to have activated MyChart and know their login information - If Apple, go to [employer] Store and type in MyChart in the search bar and download the app. If Android, ask patient to go to [employer] Play Store and type in [place] in the search bar and download the app. The app is free but as with any other app downloads, their phone may require them to verify  saved payment information or Apple/Android password.  - The patient will need to then log into the app with their MyChart username and password, and select [facility] as their healthcare provider to link the account. When it is time for your visit, go to the MyChart app, find appointments, and click Begin Video Visit. Be sure to Select Allow for your device to access the Microphone and Camera for your visit. You will then be connected, and your provider will be with you shortly.  **If they have any issues connecting, or need assistance please contact MyChart service desk [phone])**  **If using a computer, in order to ensure the best quality for their visit they will need to use either of the following Internet Browsers: [employer] Edge, or Google Chrome**  IF USING DOXIMITY or DOXY.ME - The patient will receive a link just prior to their visit by text.     FULL LENGTH CONSENT FOR TELE-HEALTH VISIT   I hereby voluntarily request, consent and authorize [facility] and its employed or contracted physicians, physician assistants, nurse practitioners or other licensed health care professionals (the Practitioner), to provide me with telemedicine health care services (the "Services") as deemed necessary by the treating Practitioner. I acknowledge and consent to receive the Services by the Practitioner via telemedicine. I understand that the telemedicine visit will involve communicating with the Practitioner through live audiovisual communication technology and the disclosure of certain medical information by electronic transmission. I acknowledge that I have been given the opportunity to request an in-person assessment or other available alternative prior to the telemedicine visit and am voluntarily participating in the telemedicine visit.  I understand that I have the right to withhold or withdraw my consent to the use of telemedicine in the course of my care at any time, without  affecting my right to future care or treatment, and that the Practitioner or I may terminate the telemedicine visit at any time. I understand that I have the right to inspect all information obtained and/or recorded in the course of the telemedicine visit and may receive copies of available information for a reasonable fee.  I understand that some of the potential risks of receiving the Services via telemedicine include:  Marland Kitchen. Delay or interruption in medical evaluation due to technological equipment failure or disruption; . Information transmitted may not be sufficient (e.g. poor resolution of images) to allow for appropriate medical decision making by the Practitioner; and/or  . In rare instances, security protocols could fail, causing a breach of personal health information.  Furthermore, I acknowledge that it is my responsibility to provide information about my medical history, conditions and care that is complete and accurate to the best of my ability. I acknowledge that Practitioner's advice, recommendations, and/or decision may be based on factors not within their control, such as incomplete or inaccurate data provided by me or distortions of diagnostic images or specimens that may result  from electronic transmissions. I understand that the practice of medicine is not an exact science and that Practitioner makes no warranties or guarantees regarding treatment outcomes. I acknowledge that I will receive a copy of this consent concurrently upon execution via email to the email address I last provided but may also request a printed copy by calling the office of [facility].    I understand that my insurance will be billed for this visit.   I have read or had this consent read to me. . I understand the contents of this consent, which adequately explains the benefits and risks of the Services being provided via telemedicine.  . I have been provided ample opportunity to ask questions regarding this  consent and the Services and have had my questions answered to my satisfaction. . I give my informed consent for the services to be provided through the use of telemedicine in my medical care  By participating in this telemedicine visit I agree to the above.

## 2018-12-18 NOTE — Telephone Encounter (Signed)
Spoke with patient who confirmed all demographics. Patient has a smart phone.  He does not want to sign up for My Chart. Will have vitals ready for visit.

## 2018-12-18 NOTE — Telephone Encounter (Signed)
Virtual Visit Pre-Appointment Phone Call  "(Name), I am calling you today to discuss your upcoming appointment. We are currently trying to limit exposure to the virus that causes COVID-19 by seeing patients at home rather than in the office."  1. "What is the BEST phone number to call the day of the visit?" - include this in appointment notes  2. Do you have or have access to (through a family member/friend) a smartphone with video capability that we can use for your visit?" a. If yes - list this number in appt notes as cell (if different from BEST phone #) and list the appointment type as a VIDEO visit in appointment notes b. If no - list the appointment type as a PHONE visit in appointment notes  Confirm consent - "In the setting of the current Covid19 crisis, you are scheduled for a (phone or video) visit with your provider on (date) at (time).  Just as we do with many in-office visits, in order for you to participate in this visit, we must obtain consent.  If you'd like, I can send this to your mychart (if signed up) or email for you to review.  Otherwise, I can obtain your verbal consent now.  All virtual visits are billed to your insurance company just like a normal visit would be.  By agreeing to a virtual visit, we'd like you to understand that the technology does not allow for your provider to perform an examination, and thus may limit your provider's ability to fully assess your condition. If your provider identifies any concerns that need to be evaluated in person, we will make arrangements to do so.  Finally, though the technology is pretty good, we cannot assure that it will always work on either your or our end, and in the setting of a video visit, we may have to convert it to a phone-only visit.  In either situation, we cannot ensure that we have a secure connection.  Are you willing to proceed?"  Patient said "yes".  3. Advise patient to be prepared - "Two hours prior to your  appointment, go ahead and check your blood pressure, pulse, oxygen saturation, and your weight (if you have the equipment to check those) and write them all down. When your visit starts, your provider will ask you for this information. If you have an Apple Watch or Kardia device, please plan to have heart rate information ready on the day of your appointment. Please have a pen and paper handy nearby the day of the visit as well."  4. Give patient instructions for MyChart download to smartphone OR [email or web address].me as below if video visit (depending on what platform provider is using)  5. Inform patient they will receive a phone call 15 minutes prior to their appointment time (may be from unknown caller ID) so they should be prepared to answer    TELEPHONE CALL NOTE  Eddie Black has been deemed a candidate for a follow-up tele-health visit to limit community exposure during the Covid-19 pandemic. I spoke with the patient via phone to ensure availability of phone/video source, confirm preferred email & phone number, and discuss instructions and expectations.  I reminded Eddie Black to be prepared with any vital sign and/or heart rhythm information that could potentially be obtained via home monitoring, at the time of his visit. I reminded Eddie Black to expect a phone call prior to his visit.  Eddie Black 12/18/2018 1:28 PM   INSTRUCTIONS FOR DOWNLOADING  THE MYCHART APP TO SMARTPHONE  - The patient must first make sure to have activated MyChart and know their login information - If Apple, go to [employer] and type in MyChart in the search bar and download the app. If Android, ask patient to go to [employer] and type in [place] in the search bar and download the app. The app is free but as with any other app downloads, their phone may require them to verify saved payment information or Apple/Android password.  - The patient will need to then log into the app with their MyChart  username and password, and select [facility] as their healthcare provider to link the account. When it is time for your visit, go to the MyChart app, find appointments, and click Begin Video Visit. Be sure to Select Allow for your device to access the Microphone and Camera for your visit. You will then be connected, and your provider will be with you shortly.  **If they have any issues connecting, or need assistance please contact MyChart service desk [phone])**  **If using a computer, in order to ensure the best quality for their visit they will need to use either of the following Internet Browsers: [employer], or Google Chrome**  IF USING DOXIMITY or DOXY.ME - The patient will receive a link just prior to their visit by text.     FULL LENGTH CONSENT FOR TELE-HEALTH VISIT   I hereby voluntarily request, consent and authorize [employer] and its employed or contracted physicians, physician assistants, nurse practitioners or other licensed health care professionals (the Practitioner), to provide me with telemedicine health care services (the Services") as deemed necessary by the treating Practitioner. I acknowledge and consent to receive the Services by the Practitioner via telemedicine. I understand that the telemedicine visit will involve communicating with the Practitioner through live audiovisual communication technology and the disclosure of certain medical information by electronic transmission. I acknowledge that I have been given the opportunity to request an in-person assessment or other available alternative prior to the telemedicine visit and am voluntarily participating in the telemedicine visit.  I understand that I have the right to withhold or withdraw my consent to the use of telemedicine in the course of my care at any time, without affecting my right to future care or treatment, and that the Practitioner or I may terminate the telemedicine visit at any  time. I understand that I have the right to inspect all information obtained and/or recorded in the course of the telemedicine visit and may receive copies of available information for a reasonable fee.  I understand that some of the potential risks of receiving the Services via telemedicine include:   Delay or interruption in medical evaluation due to technological equipment failure or disruption;  Information transmitted may not be sufficient (e.g. poor resolution of images) to allow for appropriate medical decision making by the Practitioner; and/or   In rare instances, security protocols could fail, causing a breach of personal health information.  Furthermore, I acknowledge that it is my responsibility to provide information about my medical history, conditions and care that is complete and accurate to the best of my ability. I acknowledge that Practitioner's advice, recommendations, and/or decision may be based on factors not within their control, such as incomplete or inaccurate data provided by me or distortions of diagnostic images or specimens that may result from electronic transmissions. I understand that the practice of medicine is not an exact science and that Practitioner  makes no warranties or guarantees regarding treatment outcomes. I acknowledge that I will receive a copy of this consent concurrently upon execution via email to the email address I last provided but may also request a printed copy by calling the office of [facility].    I understand that my insurance will be billed for this visit.   I have read or had this consent read to me.  I understand the contents of this consent, which adequately explains the benefits and risks of the Services being provided via telemedicine.   I have been provided ample opportunity to ask questions regarding this consent and the Services and have had my questions answered to my satisfaction.  I give my informed consent for the services  to be provided through the use of telemedicine in my medical care  By participating in this telemedicine visit I agree to the above.

## 2018-12-19 ENCOUNTER — Other Ambulatory Visit: Payer: Self-pay

## 2018-12-19 ENCOUNTER — Encounter: Payer: Self-pay | Admitting: Cardiology

## 2018-12-19 ENCOUNTER — Telehealth (INDEPENDENT_AMBULATORY_CARE_PROVIDER_SITE_OTHER): Payer: BLUE CROSS/BLUE SHIELD | Admitting: Cardiology

## 2018-12-19 VITALS — BP 141/95 | HR 63 | Ht 67.0 in | Wt 215.0 lb

## 2018-12-19 DIAGNOSIS — Z951 Presence of aortocoronary bypass graft: Secondary | ICD-10-CM

## 2018-12-19 DIAGNOSIS — I10 Essential (primary) hypertension: Secondary | ICD-10-CM

## 2018-12-19 DIAGNOSIS — I25810 Atherosclerosis of coronary artery bypass graft(s) without angina pectoris: Secondary | ICD-10-CM | POA: Diagnosis not present

## 2018-12-19 DIAGNOSIS — Z720 Tobacco use: Secondary | ICD-10-CM | POA: Diagnosis not present

## 2018-12-19 DIAGNOSIS — E119 Type 2 diabetes mellitus without complications: Secondary | ICD-10-CM

## 2018-12-19 DIAGNOSIS — E785 Hyperlipidemia, unspecified: Secondary | ICD-10-CM | POA: Diagnosis not present

## 2018-12-19 MED ORDER — ENALAPRIL MALEATE 10 MG PO TABS: 10.0000 mg | ORAL_TABLET | Freq: Every day | ORAL | 3 refills | Status: DC

## 2018-12-19 NOTE — Patient Instructions (Signed)
Medication Instructions:  Your physician has recommended you make the following change in your medication:  1. INCREASE ENALAPRIL TO 10 MG DAILY.  If you need a refill on your cardiac medications before your next appointment, please call your pharmacy.   Lab work: NONE If you have labs (blood work) drawn today and your tests are completely normal, you will receive your results only by: Marland Kitchen MyChart Message (if you have MyChart) OR . A paper copy in the mail If you have any lab test that is abnormal or we need to change your treatment, we will call you to review the results.  Testing/Procedures: NONE  Follow-Up: At [facility], you and your health needs are our priority.  As part of our continuing mission to provide you with exceptional heart care, we have created designated Provider Care Teams.  These Care Teams include your primary Cardiologist (physician) and Advanced Practice Providers (APPs -  Physician Assistants and Nurse Practitioners) who all work together to provide you with the care you need, when you need it. You will need a follow up appointment in 6 months.  Please call our office 2 months in advance to schedule this appointment.  You may see Donato Schultz, MD or one of the following Advanced Practice Providers on your designated Care Team:   Norma Fredrickson, NP Nada Boozer, NP . Georgie Chard, NP

## 2019-03-03 DIAGNOSIS — I10 Essential (primary) hypertension: Secondary | ICD-10-CM

## 2019-05-20 ENCOUNTER — Encounter: Payer: Self-pay | Admitting: Gastroenterology

## 2019-07-21 ENCOUNTER — Other Ambulatory Visit: Payer: Self-pay | Admitting: Physician Assistant

## 2019-07-21 DIAGNOSIS — M25561 Pain in right knee: Secondary | ICD-10-CM

## 2019-08-10 ENCOUNTER — Ambulatory Visit
Admission: RE | Admit: 2019-08-10 | Discharge: 2019-08-10 | Disposition: A | Discharge status: Home / Self Care | Payer: BC Managed Care – PPO | Source: Ambulatory Visit | Attending: Physician Assistant | Admitting: Physician Assistant

## 2019-08-10 ENCOUNTER — Other Ambulatory Visit: Payer: Self-pay

## 2019-08-10 DIAGNOSIS — M25561 Pain in right knee: Secondary | ICD-10-CM

## 2019-10-04 IMAGING — DX DG CHEST 1V PORT
1 series · 1 of 1 positions shown · non-contrast
Comparison: No recent prior.

CLINICAL DATA: Chest pain.

EXAM:
PORTABLE CHEST 1 VIEW

[chest]
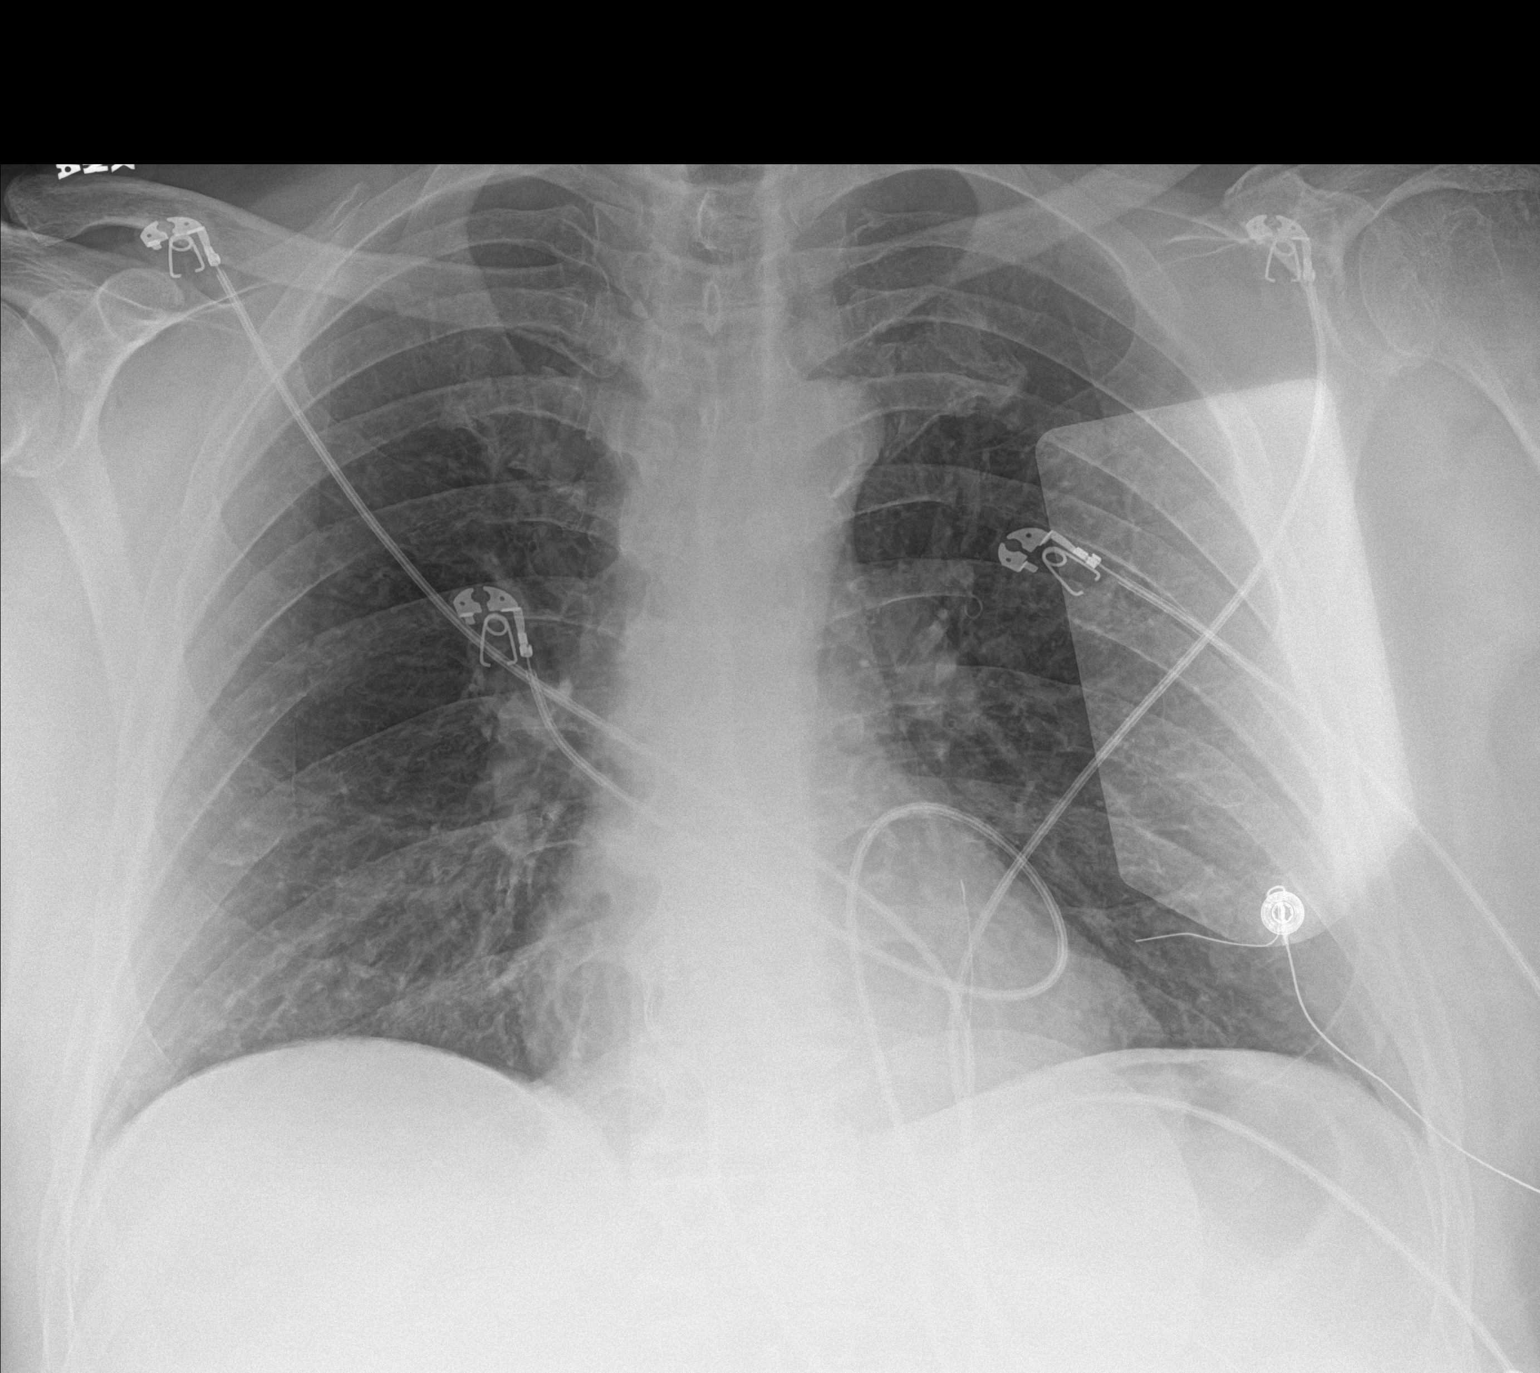

[1 of 1 positions shown; findings below may reference images not displayed]

FINDINGS: Mediastinum and hilar structures normal. Heart size normal. No focal
infiltrate. No pleural effusion or pneumothorax. Degenerative change
thoracic spine.
IMPRESSION: No acute cardiopulmonary disease.

## 2019-10-04 IMAGING — DX DG CHEST 1V PORT
1 series · 1 of 1 positions shown · non-contrast
Comparison: 10/25/2017

CLINICAL DATA: Postop CABG

EXAM:
PORTABLE CHEST 1 VIEW

[chest ap]
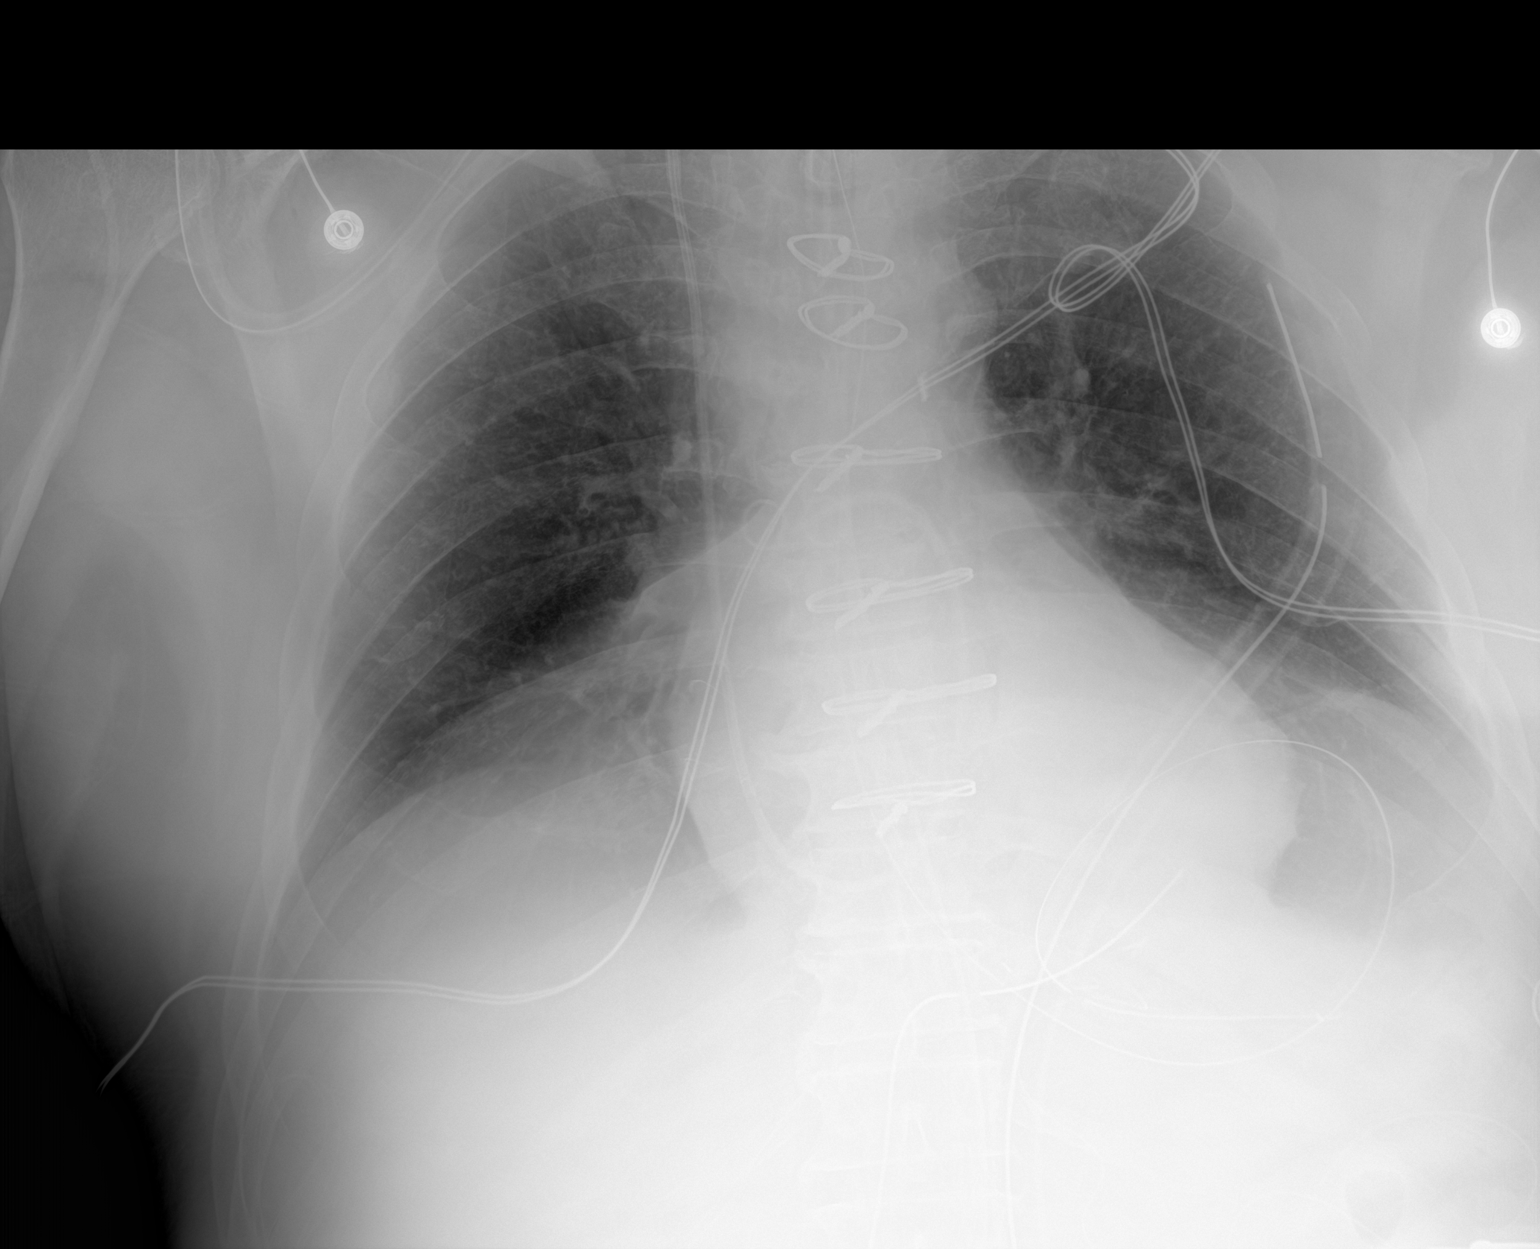

[1 of 1 positions shown; findings below may reference images not displayed]

FINDINGS: Interval sternotomy. Right IJ Swan-Ganz catheter tip overlies the
right pulmonary artery. Esophageal tube tip coiled over the left
lower chest. Left-sided and mediastinal drainage catheters are
present. There is atelectasis at both bases. Heart size within
normal limits. Possible tiny left apical pneumothorax. Endotracheal
tube tip difficult to see, appears to be about 4 cm superior to the
carina.
IMPRESSION: 1. Interval sternotomy.
2. Placement of support lines and tubes as above. Esophageal tube
appears coiled over the left lower chest, possibly within the
stomach beneath an elevated left diaphragm
3. Probable tiny left apical pneumothorax with chest tube in place
4. Atelectasis at both lung bases.

## 2019-10-21 ENCOUNTER — Other Ambulatory Visit: Payer: Self-pay

## 2019-10-21 ENCOUNTER — Encounter: Payer: Self-pay | Admitting: *Deleted

## 2019-10-21 ENCOUNTER — Ambulatory Visit: Payer: BC Managed Care – PPO | Admitting: Cardiology

## 2019-10-21 ENCOUNTER — Encounter: Payer: Self-pay | Admitting: Cardiology

## 2019-10-21 VITALS — BP 136/90 | HR 60 | Ht 67.0 in | Wt 227.0 lb

## 2019-10-21 DIAGNOSIS — Z01810 Encounter for preprocedural cardiovascular examination: Secondary | ICD-10-CM

## 2019-10-21 DIAGNOSIS — Z951 Presence of aortocoronary bypass graft: Secondary | ICD-10-CM | POA: Diagnosis not present

## 2019-10-21 DIAGNOSIS — Z720 Tobacco use: Secondary | ICD-10-CM

## 2019-10-21 DIAGNOSIS — E119 Type 2 diabetes mellitus without complications: Secondary | ICD-10-CM | POA: Diagnosis not present

## 2019-10-21 DIAGNOSIS — I10 Essential (primary) hypertension: Secondary | ICD-10-CM | POA: Diagnosis not present

## 2019-10-21 NOTE — Patient Instructions (Addendum)
Medication Instructions:  The current medical regimen is effective;  continue present plan and medications.  *If you need a refill on your cardiac medications before your next appointment, please call your pharmacy*  Follow-Up: At [facility], you and your health needs are our priority.  As part of our continuing mission to provide you with exceptional heart care, we have created designated Provider Care Teams.  These Care Teams include your primary Cardiologist (physician) and Advanced Practice Providers (APPs -  Physician Assistants and Nurse Practitioners) who all work together to provide you with the care you need, when you need it.  We recommend signing up for the patient portal called "MyChart".  Sign up information is provided on this After Visit Summary.  MyChart is used to connect with patients for Virtual Visits (Telemedicine).  Patients are able to view lab/test results, encounter notes, upcoming appointments, etc.  Non-urgent messages can be sent to your provider as well.   To learn more about what you can do with MyChart, go to [email or web address].    Your next appointment:   12 month(s)  The format for your next appointment:   In Person  Provider:   Mark Skains, MD   Thank you for choosing [facility]!!      
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
## 2019-11-02 ENCOUNTER — Telehealth: Payer: Self-pay | Admitting: Cardiology

## 2019-11-02 NOTE — Telephone Encounter (Signed)
New Message       [facility] Pre-operative Risk Assessment    Request for surgical clearance:  1. What type of surgery is being performed? Right knee arothroscopy   2. When is this surgery scheduled? TBD   3. What type of clearance is required (medical clearance vs. Pharmacy clearance to hold med vs. Both)? Medical   4. Are there any medications that need to be held prior to surgery and how long? No   5. Practice name and name of physician performing surgery? [facility] Dr Dellis Filbert yaste  6. What is your office phone number [phone]   7.   What is your office fax number [phone]  8.   Anesthesia type (None, local, MAC, general) ? General    Marca Ancona 11/02/2019, 3:23 PM  _________________________________________________________________   (provider comments below)

## 2019-11-02 NOTE — Telephone Encounter (Signed)
   Primary Cardiologist: Donato Schultz, MD  Chart reviewed as part of pre-operative protocol coverage. Preoperative status was addressed at his visit with Dr. Anne Fu 10/21/2019. Cleared without further cardiovascular testing.   I will route this recommendation, as well as Dr. Anne Fu' office note to the requesting party via Epic fax function and remove from pre-op pool.  Please call with questions.  Beatriz Stallion, PA-C 11/02/2019, 4:34 PM

## 2019-12-09 DIAGNOSIS — Z01818 Encounter for other preprocedural examination: Secondary | ICD-10-CM

## 2020-09-28 ENCOUNTER — Telehealth: Payer: Self-pay | Admitting: *Deleted

## 2020-09-28 NOTE — Telephone Encounter (Signed)
   [facility] Pre-operative Risk Assessment    HEARTCARE STAFF: - Please ensure there is not already an duplicate clearance open for this procedure. - Under Visit Info/Reason for Call, type in Other and utilize the format Clearance MM/DD/YY or Clearance TBD. Do not use dashes or single digits. - If request is for dental extraction, please clarify the # of teeth to be extracted.  Request for surgical clearance:  1. What type of surgery is being performed? RIGHT TOTAL HIP ARTHROPLASTY   2. When is this surgery scheduled? TBD   3. What type of clearance is required (medical clearance vs. Pharmacy clearance to hold med vs. Both)? MEDICAL  4. Are there any medications that need to be held prior to surgery and how long? ASA   5. Practice name and name of physician performing surgery? [facility]; DR. Deirdre Peer DURRANI  6. What is the office phone number? [phone]   7.   What is the office fax number? [place].   Anesthesia type (None, local, MAC, general) ? GENERAL   Julaine Hua 09/28/2020, 4:58 PM  _________________________________________________________________   (provider comments below)

## 2020-09-29 NOTE — Telephone Encounter (Signed)
I tried to reach out to the pt to schedule a pre op appt. I did s/w Joni Reining, DNP, ok to use 72 hour time slot 10/14/20 if pt is available.

## 2020-09-29 NOTE — Telephone Encounter (Signed)
   Primary Cardiologist: Donato Schultz, MD  Chart reviewed as part of pre-operative protocol coverage. Because of Eddie Black's past medical history and time since last visit, he will require a follow-up visit in order to better assess preoperative cardiovascular risk.  Pre-op covering staff: - Please schedule appointment and call patient to inform them. If patient already had an upcoming appointment within acceptable timeframe, please add "pre-op clearance" to the appointment notes so provider is aware. - Please contact requesting surgeon's office via preferred method (i.e, phone, fax) to inform them of need for appointment prior to surgery.  If applicable, this message will also be routed to pharmacy pool and/or primary cardiologist for input on holding anticoagulant/antiplatelet agent as requested below so that this information is available to the clearing provider at time of patient's appointment.   [place], [place]  09/29/2020, 9:08 AM

## 2020-09-30 NOTE — Telephone Encounter (Signed)
I have attempted to contact this patient by phone, but no vm.  I will continue to try later.

## 2020-10-03 NOTE — Telephone Encounter (Signed)
Pt already has appt w/Dr Anne Fu on 10-27-20. Will add preop to appt notes as to make Dr Anne Fu aware to do preop clearance alsp

## 2020-10-03 NOTE — Telephone Encounter (Signed)
Forwarded to requesting party via EPIC fax function 

## 2020-10-27 ENCOUNTER — Encounter: Payer: Self-pay | Admitting: Cardiology

## 2020-10-27 ENCOUNTER — Ambulatory Visit: Payer: BC Managed Care – PPO | Admitting: Cardiology

## 2020-10-27 ENCOUNTER — Other Ambulatory Visit: Payer: Self-pay

## 2020-10-27 VITALS — BP 124/80 | HR 82 | Ht 67.0 in | Wt 227.0 lb

## 2020-10-27 DIAGNOSIS — Z01810 Encounter for preprocedural cardiovascular examination: Secondary | ICD-10-CM

## 2020-10-27 DIAGNOSIS — E119 Type 2 diabetes mellitus without complications: Secondary | ICD-10-CM

## 2020-10-27 DIAGNOSIS — Z951 Presence of aortocoronary bypass graft: Secondary | ICD-10-CM

## 2020-10-27 DIAGNOSIS — E785 Hyperlipidemia, unspecified: Secondary | ICD-10-CM | POA: Diagnosis not present

## 2020-10-27 DIAGNOSIS — I10 Essential (primary) hypertension: Secondary | ICD-10-CM

## 2020-10-27 LAB — COMPREHENSIVE METABOLIC PANEL
ALT: 46 IU/L — ABNORMAL HIGH (ref 0–44)
AST: 32 IU/L (ref 0–40)
Albumin/Globulin Ratio: 1.8 (ref 1.2–2.2)
Albumin: 4.6 g/dL (ref 3.8–4.9)
Alkaline Phosphatase: 107 IU/L (ref 44–121)
BUN/Creatinine Ratio: 9 (ref 9–20)
BUN: 9 mg/dL (ref 6–24)
Bilirubin Total: 0.5 mg/dL (ref 0.0–1.2)
CO2: 20 mmol/L (ref 20–29)
Calcium: 9.4 mg/dL (ref 8.7–10.2)
Chloride: 100 mmol/L (ref 96–106)
Creatinine, Ser: 0.95 mg/dL (ref 0.76–1.27)
Globulin, Total: 2.5 g/dL (ref 1.5–4.5)
Glucose: 107 mg/dL — ABNORMAL HIGH (ref 65–99)
Potassium: 4.5 mmol/L (ref 3.5–5.2)
Sodium: 142 mmol/L (ref 134–144)
Total Protein: 7.1 g/dL (ref 6.0–8.5)
eGFR: 93 mL/min/{1.73_m2} (ref >59)

## 2020-10-27 LAB — LIPID PANEL
Chol/HDL Ratio: 3.5 ratio (ref 0.0–5.0)
Cholesterol, Total: 173 mg/dL (ref 100–199)
HDL: 50 mg/dL (ref >39)
LDL Chol Calc (NIH): 102 mg/dL — ABNORMAL HIGH (ref 0–99)
Triglycerides: 114 mg/dL (ref 0–149)
VLDL Cholesterol Cal: 21 mg/dL (ref 5–40)

## 2020-10-27 LAB — CBC WITH DIFFERENTIAL/PLATELET
Basophils Absolute: 0.1 10*3/uL (ref 0.0–0.2)
Basos: 1 %
EOS (ABSOLUTE): 0.7 10*3/uL — ABNORMAL HIGH (ref 0.0–0.4)
Eos: 5 %
Hematocrit: 46.1 % (ref 37.5–51.0)
Hemoglobin: 15.4 g/dL (ref 13.0–17.7)
Immature Grans (Abs): 0 10*3/uL (ref 0.0–0.1)
Immature Granulocytes: 0 %
Lymphocytes Absolute: 1.8 10*3/uL (ref 0.7–3.1)
Lymphs: 13 %
MCH: 30.5 pg (ref 26.6–33.0)
MCHC: 33.4 g/dL (ref 31.5–35.7)
MCV: 91 fL (ref 79–97)
Monocytes Absolute: 1.3 10*3/uL — ABNORMAL HIGH (ref 0.1–0.9)
Monocytes: 9 %
Neutrophils Absolute: 9.5 10*3/uL — ABNORMAL HIGH (ref 1.4–7.0)
Neutrophils: 72 %
Platelets: 315 10*3/uL (ref 150–450)
RBC: 5.05 x10E6/uL (ref 4.14–5.80)
RDW: 13 % (ref 11.6–15.4)
WBC: 13.3 10*3/uL — ABNORMAL HIGH (ref 3.4–10.8)

## 2020-10-27 NOTE — Patient Instructions (Signed)
Medication Instructions:  Your provider recommends that you continue on your current medications as directed. Please refer to the Current Medication list given to you today.   *If you need a refill on your cardiac medications before your next appointment, please call your pharmacy*  Lab Work: TODAY! CMET, CBC. lipids If you have labs (blood work) drawn today and your tests are completely normal, you will receive your results only by: Marland Kitchen MyChart Message (if you have MyChart) OR . A paper copy in the mail If you have any lab test that is abnormal or we need to change your treatment, we will call you to review the results.  Follow-Up: At [facility], you and your health needs are our priority.  As part of our continuing mission to provide you with exceptional heart care, we have created designated Provider Care Teams.  These Care Teams include your primary Cardiologist (physician) and Advanced Practice Providers (APPs -  Physician Assistants and Nurse Practitioners) who all work together to provide you with the care you need, when you need it. Your next appointment:   12 month(s) The format for your next appointment:   In Person Provider:   You may see Donato Schultz, MD or one of the following Advanced Practice Providers on your designated Care Team:    Georgie Chard, NP    You are cleared for hip surgery!

## 2020-10-27 NOTE — Progress Notes (Signed)
Cardiology Office Note:    Date:  10/27/2020   ID:  Eddie Black, DOB [birth year], MRN [id number]  PCP:  Eddie Greenspan, MD   [facility]  Cardiologist:  Donato Schultz, MD  Advanced Practice Provider:  No care team member to display Electrophysiologist:  None       Referring MD: Eddie Greenspan, MD     History of Present Illness:    Eddie Black is a 60 y.o. male here for the follow-up of prior VF arrest, multiple shocks, emergency CABG, EF preserved with no ICD placement.  This took place on 10/25/2017.  Main complaint continues to be arthritic pain.  Worried that he cannot take anything but Tylenol.  Overall been doing quite well except for this hip pain.  He is denied any fevers chills nausea vomiting syncope bleeding arrhythmias chest pain shortness of breath.  Taking all of his medications.  No myalgias.  Past Medical History:  Diagnosis Date  . Diabetes mellitus (HCC)   . Hyperlipemia   . Hypertension   . Tobacco abuse     Past Surgical History:  Procedure Laterality Date  . CORONARY ARTERY BYPASS GRAFT N/A 10/25/2017   Procedure: CORONARY ARTERY BYPASS GRAFTING (CABG) times three, WITH ENDOSCOPIC HARVESTING OF RIGHT SAPHENOUS VEIN;  Surgeon: Eddie Perna, MD;  Location: [facility] OR;  Service: Open Heart Surgery;  Laterality: N/A;  . IABP INSERTION N/A 10/25/2017   Procedure: IABP Insertion;  Surgeon: Eddie Records, MD;  Location: [facility] INVASIVE CV LAB;  Service: Cardiovascular;  Laterality: N/A;  . LEFT HEART CATH AND CORONARY ANGIOGRAPHY N/A 10/25/2017   Procedure: LEFT HEART CATH AND CORONARY ANGIOGRAPHY;  Surgeon: Eddie Records, MD;  Location: [facility] INVASIVE CV LAB;  Service: Cardiovascular;  Laterality: N/A;  . TEE WITHOUT CARDIOVERSION N/A 10/25/2017   Procedure: TRANSESOPHAGEAL ECHOCARDIOGRAM (TEE);  Surgeon: Eddie Black, Eddie Arista, MD;  Location: [facility] OR;  Service: Open Heart Surgery;  Laterality: N/A;    Current Medications: Current Meds  Medication  Sig  . aspirin EC 81 MG tablet Take 1 tablet (81 mg total) by mouth daily.  Marland Kitchen atorvastatin (LIPITOR) 40 MG tablet Take 1 tablet (40 mg total) by mouth at bedtime.  Marland Kitchen buPROPion (WELLBUTRIN SR) 150 MG 12 hr tablet Take 150 mg by mouth 2 (two) times daily.  . diclofenac (VOLTAREN) 75 MG EC tablet Take 75 mg by mouth 2 (two) times daily.  . enalapril (VASOTEC) 10 MG tablet Take 1 tablet (10 mg total) by mouth daily.  Marland Kitchen JARDIANCE 25 MG TABS tablet Take 25 mg by mouth daily.  Marland Kitchen levothyroxine (SYNTHROID, LEVOTHROID) 25 MCG tablet Take 25 mcg by mouth daily.  . metFORMIN (GLUCOPHAGE) 500 MG tablet Take 500 mg by mouth 2 (two) times daily with a meal.  . metoprolol succinate (TOPROL-XL) 25 MG 24 hr tablet Take 25 mg by mouth daily.  Marland Kitchen olmesartan (BENICAR) 20 MG tablet Take 20 mg by mouth daily.  . Omega-3 Fatty Acids (FISH OIL TRIPLE STRENGTH PO) Take 1 capsule by mouth 2 (two) times daily.     Allergies:   Patient has no known allergies.   Social History   Socioeconomic History  . Marital status: Married    Spouse name: Not on file  . Number of children: Not on file  . Years of education: Not on file  . Highest education level: Not on file  Occupational History  . Not on file  Tobacco Use  . Smoking status: Former Games developer  .  Smokeless tobacco: Never Used  Vaping Use  . Vaping Use: Every day  Substance and Sexual Activity  . Alcohol use: Yes  . Drug use: Yes    Types: Marijuana  . Sexual activity: Not on file  Other Topics Concern  . Not on file  Social History Narrative  . Not on file   Social Determinants of Health   Financial Resource Strain: Not on file  Food Insecurity: Not on file  Transportation Needs: Not on file  Physical Activity: Not on file  Stress: Not on file  Social Connections: Not on file     Family History: The patient's Family history is unknown by patient.  ROS:   Please see the history of present illness.     All other systems reviewed and are  negative.  EKGs/Labs/Other Studies Reviewed:    The following studies were reviewed today:  Cath 2019: Diagnostic Dominance: Right   ECHO 2019:  - Left ventricle: The cavity size was normal. Wall thickness was  normal. Systolic function was vigorous. The estimated ejection  fraction was in the range of 65% to 70%.   EKG:  EKG is  ordered today.  The ekg ordered today demonstrates NSR 82 NSSTW changes  Recent Labs: No results found for requested labs within last 8760 hours.  Recent Lipid Panel    Component Value Date/Time   CHOL 125 10/25/2017 1529   TRIG 40 10/25/2017 1529   HDL 42 10/25/2017 1529   CHOLHDL 3.0 10/25/2017 1529   VLDL 8 10/25/2017 1529   LDLCALC 75 10/25/2017 1529     Risk Assessment/Calculations:      Physical Exam:    VS:  BP 124/80 (BP Location: Left Arm, Patient Position: Sitting, Cuff Size: Normal)   Pulse 82   Ht 5\' 7"  (1.702 m)   Wt 227 lb (103 kg)   BMI 35.55 kg/m     Wt Readings from Last 3 Encounters:  10/27/20 227 lb (103 kg)  10/21/19 227 lb (103 kg)  12/19/18 215 lb (97.5 kg)     GEN:  Well nourished, well developed in no acute distress HEENT: Normal NECK: No JVD; No carotid bruits LYMPHATICS: No lymphadenopathy CARDIAC: RRR, no murmurs, rubs, gallops RESPIRATORY:  Clear to auscultation without rales, wheezing or rhonchi  ABDOMEN: Soft, non-tender, non-distended MUSCULOSKELETAL:  No edema; No deformity  SKIN: Warm and dry NEUROLOGIC:  Alert and oriented x 3 PSYCHIATRIC:  Normal affect   ASSESSMENT:    1. Pre-operative cardiovascular examination   2. Hyperlipidemia, unspecified hyperlipidemia type   3. S/P CABG (coronary artery bypass graft)   4. Diabetes mellitus with coincident hypertension (HCC)    PLAN:    In order of problems listed above:  Preop cardiac risk -Last year had his knee operated on, knee replacement did well.  No difficulties.  Currently he is wishing to undergo a right hip replacement.   Since he is not having any high risk symptoms, no syncope no anginal symptoms no shortness of breath no arrhythmias, he may go ahead and proceed with low to moderate cardiac symptoms based on his prior CABG.  Overall doing well.  Prior cardiac arrest ventricular fibrillatory arrest -Severe coronary artery disease status post emergent CABG x3 LIMA to LAD SVG to PL and SVG to diagonal has normal ejection fraction.  Reassuring continue with goal-directed medical therapy.  Tobacco use -Continue to encourage cessation.  Diabetes with hypertension -Currently on Jardiance, Metformin.  Excellent.  Last hemoglobin A1c in our system  6.9.  Hyperlipidemia -We are going to check a metabolic profile as well as lipid panel.  Last one I have on record here is from LDL 2020.  KPN tool.  Checking labs today 1 year follow up      Medication Adjustments/Labs and Tests Ordered: Current medicines are reviewed at length with the patient today.  Concerns regarding medicines are outlined above.  Orders Placed This Encounter  Procedures  . Comprehensive metabolic panel  . CBC with Differential/Platelet  . Lipid panel  . EKG 12-Lead   No orders of the defined types were placed in this encounter.   Patient Instructions  Medication Instructions:  Your provider recommends that you continue on your current medications as directed. Please refer to the Current Medication list given to you today.   *If you need a refill on your cardiac medications before your next appointment, please call your pharmacy*  Lab Work: TODAY! CMET, CBC. lipids If you have labs (blood work) drawn today and your tests are completely normal, you will receive your results only by: Marland Kitchen MyChart Message (if you have MyChart) OR . A paper copy in the mail If you have any lab test that is abnormal or we need to change your treatment, we will call you to review the results.  Follow-Up: At [facility], you and your health needs are our  priority.  As part of our continuing mission to provide you with exceptional heart care, we have created designated Provider Care Teams.  These Care Teams include your primary Cardiologist (physician) and Advanced Practice Providers (APPs -  Physician Assistants and Nurse Practitioners) who all work together to provide you with the care you need, when you need it. Your next appointment:   12 month(s) The format for your next appointment:   In Person Provider:   You may see Donato Schultz, MD or one of the following Advanced Practice Providers on your designated Care Team:    Georgie Chard, NP    You are cleared for hip surgery!    Signed, Donato Schultz, MD  10/27/2020 9:21 AM    [facility]

## 2020-11-03 ENCOUNTER — Telehealth: Payer: Self-pay | Admitting: *Deleted

## 2020-11-03 DIAGNOSIS — Z79899 Other long term (current) drug therapy: Secondary | ICD-10-CM

## 2020-11-03 DIAGNOSIS — Z951 Presence of aortocoronary bypass graft: Secondary | ICD-10-CM

## 2020-11-03 DIAGNOSIS — R7401 Elevation of levels of liver transaminase levels: Secondary | ICD-10-CM

## 2020-11-03 DIAGNOSIS — E785 Hyperlipidemia, unspecified: Secondary | ICD-10-CM

## 2020-11-03 MED ORDER — ROSUVASTATIN CALCIUM 40 MG PO TABS: 40.0000 mg | ORAL_TABLET | Freq: Every day | ORAL | 1 refills | Status: DC

## 2020-11-03 NOTE — Telephone Encounter (Signed)
-----   Message from Jake Bathe, MD sent at 10/28/2020  1:33 PM EDT ----- Mildly elevated ALT 46  Chronically elevated WBC 13.3 LDL 102 (goal <70)  Change atorvastatin to Crestor 40mg  PO QD Repeat lipids and ALT in 3 months.   , MD

## 2020-11-03 NOTE — Telephone Encounter (Signed)
Pt aware of lab results and recommendations per Dr. Anne Fu.  He is aware to stop atorvastatin and start Crestor 40 mg po daily, and come in for repeat lipid and ALT in 3 months.  Confirmed the pharmacy of choice with the pt.  Scheduled pt to come in for lab in 3 months on 02/07/21.  He is aware to come fasting.  Pt verbalized understanding and agrees with this plan.

## 2021-01-16 ENCOUNTER — Telehealth: Payer: Self-pay | Admitting: Cardiology

## 2021-01-16 NOTE — Telephone Encounter (Signed)
Olegario Messier fulfillment specialist from [facility], is calling requesting a dictated report of the pts office visit on 10/27/20. States last report only contained labs...Marland Kitchen please advise.

## 2021-01-16 NOTE — Telephone Encounter (Signed)
Spoke with Eddie Black in HIM who reports she has now seen the request for office note from 3/09/27/2020 by Dr Anne Fu, from [facility] and has sent it to them as requested.

## 2021-02-07 ENCOUNTER — Other Ambulatory Visit: Payer: BC Managed Care – PPO

## 2021-02-15 ENCOUNTER — Telehealth: Payer: Self-pay | Admitting: *Deleted

## 2021-02-15 NOTE — Telephone Encounter (Signed)
   [facility] Pre-operative Risk Assessment    Patient Name: Eddie Black  DOB: Feb 01, 1961 MRN: [id number]  HEARTCARE STAFF:  - IMPORTANT!!!!!! Under Visit Info/Reason for Call, type in Other and utilize the format Clearance MM/DD/YY or Clearance TBD. Do not use dashes or single digits. - Please review there is not already an duplicate clearance open for this procedure. - If request is for dental extraction, please clarify the # of teeth to be extracted. - If the patient is currently at the dentist's office, call Pre-Op Callback Staff (MA/nurse) to input urgent request.  - If the patient is not currently in the dentist office, please route to the Pre-Op pool.  Request for surgical clearance:  What type of surgery is being performed? RIGHT HIP REPLACEMENT  When is this surgery scheduled? TBD  What type of clearance is required (medical clearance vs. Pharmacy clearance to hold med vs. Both)? MEDICAL; SURGEON ALSO WANTS TO MAKE SURE THE PT IS CLEARED FOR TRAVEL AFTER SURGERY AS THIS WILL BE DONE IN [place]  Are there any medications that need to be held prior to surgery and how long? ASA   Practice name and name of physician performing surgery? [facility]; SURGEON NOT LISTED  What is the office phone number? [phone]   7.   What is the office fax number? [phone] ATTN: Miguel Rota, MSN, RN  8.   Anesthesia type (None, local, MAC, general) ? NOT LISTED    Julaine Hua 02/15/2021, 5:23 PM  _________________________________________________________________   (provider comments below)

## 2021-02-20 NOTE — Telephone Encounter (Signed)
Eddie Black 60 year old male is requesting preoperative cardiac evaluation for right hip replacement.  He was previously seen in the clinic on 10/27/2020.  He was cleared for his hip surgery at that time.  He continued to do well status post CABG.  His PMH includes hyperlipidemia, coronary artery disease status post CABG, diabetes mellitus, tobacco abuse, and essential hypertension.  His aspirin be held prior to his surgery?  Surgeon would also like to know if patient will be able to travel postoperatively.  Thank you for your help.  Please direct your response to CV DIV preop pool.  Eddie Black. Eddie Vanbeek NP-C    02/20/2021, 10:03 AM [facility] [address] Office [phone] Fax [phone]

## 2021-02-21 NOTE — Telephone Encounter (Signed)
Patient and spouse answered the phone.  Patient unavailable at this time.  Patient is call back.  9:42 AM on 02/21/2021

## 2021-02-23 NOTE — Telephone Encounter (Signed)
Unable to leave message [phone] on 02/23/2021.  Patient needs call back.

## 2021-03-01 NOTE — Telephone Encounter (Signed)
Callback pool, please contact the requesting office.  Since the location of the surgery has been moved to [place], I assume the surgeon's office will want the last EKG and also last office visit with Dr. Anne Fu forwarded to them.  If so, please have medical records forwarded those records to the surgeon's office prior to the patient's surgery.

## 2021-03-01 NOTE — Telephone Encounter (Signed)
   Name: Eddie Black  DOB: [birth year]  MRN: [id number]   Primary Cardiologist: Donato Schultz, MD  Chart reviewed as part of pre-operative protocol coverage. Patient was contacted 03/01/2021 in reference to pre-operative risk assessment for pending surgery as outlined below.  Eddie Black was last seen on 10/27/2020 by Dr. Donato Schultz.  Since that day, Eddie Black has done well without any exertional chest pain or worsening shortness of breath.  Patient may hold aspirin for 5 days prior to the surgery and restart as soon as possible afterward.  See Dr. Anne Fu recommendation with traveling after surgery  Therefore, based on ACC/AHA guidelines, the patient would be at acceptable risk for the planned procedure without further cardiovascular testing.   The patient was advised that if he develops new symptoms prior to surgery to contact our office to arrange for a follow-up visit, and he verbalized understanding.  I will route this recommendation to the requesting party via Epic fax function and remove from pre-op pool. Please call with questions.  Dime Box, [place] 03/01/2021, 6:40 PM

## 2021-03-02 NOTE — Telephone Encounter (Signed)
Spoke with pt, he has confirmed that he has been made aware of his surgery clearance. I will route back to the requesting surgeon's office to make them aware pt has been cleared.  If they require any records, they can call and talk to medical records.

## 2021-03-22 NOTE — Telephone Encounter (Signed)
[name] is calling stating the requesting office is also wanting the last OV note and EKG. Please advise.

## 2021-03-22 NOTE — Telephone Encounter (Signed)
    Last office note and EKG sent to requesting team 03/22/21.    Georgie Chard NP-C [facility] Pager: [phone]

## 2021-05-03 DIAGNOSIS — M1611 Unilateral primary osteoarthritis, right hip: Secondary | ICD-10-CM | POA: Diagnosis not present

## 2021-05-03 DIAGNOSIS — Z01818 Encounter for other preprocedural examination: Secondary | ICD-10-CM | POA: Diagnosis not present

## 2021-05-03 DIAGNOSIS — Z6836 Body mass index (BMI) 36.0-36.9, adult: Secondary | ICD-10-CM | POA: Diagnosis not present

## 2021-06-12 ENCOUNTER — Other Ambulatory Visit: Payer: Self-pay | Admitting: Cardiology

## 2021-06-12 DIAGNOSIS — Z951 Presence of aortocoronary bypass graft: Secondary | ICD-10-CM

## 2021-06-12 DIAGNOSIS — R7401 Elevation of levels of liver transaminase levels: Secondary | ICD-10-CM

## 2021-06-12 DIAGNOSIS — E785 Hyperlipidemia, unspecified: Secondary | ICD-10-CM

## 2021-06-12 DIAGNOSIS — Z79899 Other long term (current) drug therapy: Secondary | ICD-10-CM

## 2021-06-29 DIAGNOSIS — E119 Type 2 diabetes mellitus without complications: Secondary | ICD-10-CM | POA: Diagnosis not present

## 2021-06-30 DIAGNOSIS — Z23 Encounter for immunization: Secondary | ICD-10-CM | POA: Diagnosis not present

## 2021-06-30 DIAGNOSIS — E785 Hyperlipidemia, unspecified: Secondary | ICD-10-CM | POA: Diagnosis not present

## 2021-06-30 DIAGNOSIS — F17200 Nicotine dependence, unspecified, uncomplicated: Secondary | ICD-10-CM | POA: Diagnosis not present

## 2021-06-30 DIAGNOSIS — E1159 Type 2 diabetes mellitus with other circulatory complications: Secondary | ICD-10-CM | POA: Diagnosis not present

## 2021-06-30 DIAGNOSIS — Z1331 Encounter for screening for depression: Secondary | ICD-10-CM | POA: Diagnosis not present

## 2021-06-30 DIAGNOSIS — Z01818 Encounter for other preprocedural examination: Secondary | ICD-10-CM | POA: Diagnosis not present

## 2021-06-30 DIAGNOSIS — E1169 Type 2 diabetes mellitus with other specified complication: Secondary | ICD-10-CM | POA: Diagnosis not present

## 2021-06-30 DIAGNOSIS — F17210 Nicotine dependence, cigarettes, uncomplicated: Secondary | ICD-10-CM | POA: Diagnosis not present

## 2021-06-30 DIAGNOSIS — I152 Hypertension secondary to endocrine disorders: Secondary | ICD-10-CM | POA: Diagnosis not present

## 2021-07-11 DIAGNOSIS — M1611 Unilateral primary osteoarthritis, right hip: Secondary | ICD-10-CM | POA: Insufficient documentation

## 2021-07-11 HISTORY — DX: Unilateral primary osteoarthritis, right hip: M16.11

## 2021-07-18 DIAGNOSIS — Z96641 Presence of right artificial hip joint: Secondary | ICD-10-CM

## 2021-07-18 HISTORY — DX: Presence of right artificial hip joint: Z96.641

## 2021-08-11 DIAGNOSIS — F17200 Nicotine dependence, unspecified, uncomplicated: Secondary | ICD-10-CM | POA: Diagnosis not present

## 2021-08-11 DIAGNOSIS — F17210 Nicotine dependence, cigarettes, uncomplicated: Secondary | ICD-10-CM | POA: Diagnosis not present

## 2021-08-11 DIAGNOSIS — M1611 Unilateral primary osteoarthritis, right hip: Secondary | ICD-10-CM | POA: Diagnosis not present

## 2021-10-10 DIAGNOSIS — E1159 Type 2 diabetes mellitus with other circulatory complications: Secondary | ICD-10-CM | POA: Diagnosis not present

## 2021-10-10 DIAGNOSIS — I25119 Atherosclerotic heart disease of native coronary artery with unspecified angina pectoris: Secondary | ICD-10-CM | POA: Diagnosis not present

## 2021-10-10 DIAGNOSIS — E785 Hyperlipidemia, unspecified: Secondary | ICD-10-CM | POA: Diagnosis not present

## 2021-10-10 DIAGNOSIS — E1169 Type 2 diabetes mellitus with other specified complication: Secondary | ICD-10-CM | POA: Diagnosis not present

## 2021-12-11 ENCOUNTER — Other Ambulatory Visit: Payer: Self-pay | Admitting: Cardiology

## 2021-12-11 DIAGNOSIS — Z951 Presence of aortocoronary bypass graft: Secondary | ICD-10-CM

## 2021-12-11 DIAGNOSIS — E785 Hyperlipidemia, unspecified: Secondary | ICD-10-CM

## 2021-12-11 DIAGNOSIS — R7401 Elevation of levels of liver transaminase levels: Secondary | ICD-10-CM

## 2021-12-11 DIAGNOSIS — Z79899 Other long term (current) drug therapy: Secondary | ICD-10-CM

## 2022-02-20 DIAGNOSIS — E785 Hyperlipidemia, unspecified: Secondary | ICD-10-CM | POA: Diagnosis not present

## 2022-02-20 DIAGNOSIS — Z6837 Body mass index (BMI) 37.0-37.9, adult: Secondary | ICD-10-CM | POA: Diagnosis not present

## 2022-02-20 DIAGNOSIS — I25119 Atherosclerotic heart disease of native coronary artery with unspecified angina pectoris: Secondary | ICD-10-CM | POA: Diagnosis not present

## 2022-02-20 DIAGNOSIS — F17210 Nicotine dependence, cigarettes, uncomplicated: Secondary | ICD-10-CM | POA: Diagnosis not present

## 2022-02-20 DIAGNOSIS — Z23 Encounter for immunization: Secondary | ICD-10-CM | POA: Diagnosis not present

## 2022-02-20 DIAGNOSIS — E1169 Type 2 diabetes mellitus with other specified complication: Secondary | ICD-10-CM | POA: Diagnosis not present

## 2022-02-20 DIAGNOSIS — J449 Chronic obstructive pulmonary disease, unspecified: Secondary | ICD-10-CM | POA: Diagnosis not present

## 2022-05-07 DIAGNOSIS — M7989 Other specified soft tissue disorders: Secondary | ICD-10-CM | POA: Diagnosis not present

## 2022-05-07 DIAGNOSIS — Z6834 Body mass index (BMI) 34.0-34.9, adult: Secondary | ICD-10-CM | POA: Diagnosis not present

## 2022-05-07 DIAGNOSIS — M25579 Pain in unspecified ankle and joints of unspecified foot: Secondary | ICD-10-CM | POA: Diagnosis not present

## 2022-05-11 DIAGNOSIS — R7982 Elevated C-reactive protein (CRP): Secondary | ICD-10-CM | POA: Diagnosis not present

## 2022-05-11 DIAGNOSIS — E1151 Type 2 diabetes mellitus with diabetic peripheral angiopathy without gangrene: Secondary | ICD-10-CM | POA: Diagnosis not present

## 2022-05-11 DIAGNOSIS — R937 Abnormal findings on diagnostic imaging of other parts of musculoskeletal system: Secondary | ICD-10-CM | POA: Diagnosis not present

## 2022-05-11 DIAGNOSIS — M7989 Other specified soft tissue disorders: Secondary | ICD-10-CM | POA: Diagnosis not present

## 2022-05-11 DIAGNOSIS — M25579 Pain in unspecified ankle and joints of unspecified foot: Secondary | ICD-10-CM | POA: Diagnosis not present

## 2022-05-14 DIAGNOSIS — M10072 Idiopathic gout, left ankle and foot: Secondary | ICD-10-CM | POA: Diagnosis not present

## 2022-05-14 DIAGNOSIS — M7752 Other enthesopathy of left foot: Secondary | ICD-10-CM | POA: Diagnosis not present

## 2022-05-14 DIAGNOSIS — M79672 Pain in left foot: Secondary | ICD-10-CM | POA: Diagnosis not present

## 2022-05-14 DIAGNOSIS — M19079 Primary osteoarthritis, unspecified ankle and foot: Secondary | ICD-10-CM | POA: Diagnosis not present

## 2022-05-16 ENCOUNTER — Other Ambulatory Visit: Payer: Self-pay | Admitting: Family Medicine

## 2022-05-16 ENCOUNTER — Encounter (HOSPITAL_COMMUNITY): Payer: Self-pay

## 2022-05-16 ENCOUNTER — Emergency Department (HOSPITAL_COMMUNITY)
Admission: EM | Admit: 2022-05-16 | Discharge: 2022-05-17 | Disposition: A | Discharge status: Home / Self Care | Payer: BC Managed Care – PPO | Attending: Emergency Medicine | Admitting: Emergency Medicine

## 2022-05-16 ENCOUNTER — Emergency Department (HOSPITAL_COMMUNITY): Payer: BC Managed Care – PPO

## 2022-05-16 ENCOUNTER — Other Ambulatory Visit: Payer: Self-pay

## 2022-05-16 DIAGNOSIS — E1165 Type 2 diabetes mellitus with hyperglycemia: Secondary | ICD-10-CM | POA: Diagnosis not present

## 2022-05-16 DIAGNOSIS — M19072 Primary osteoarthritis, left ankle and foot: Secondary | ICD-10-CM | POA: Diagnosis not present

## 2022-05-16 DIAGNOSIS — Z7984 Long term (current) use of oral hypoglycemic drugs: Secondary | ICD-10-CM | POA: Insufficient documentation

## 2022-05-16 DIAGNOSIS — Z7982 Long term (current) use of aspirin: Secondary | ICD-10-CM | POA: Insufficient documentation

## 2022-05-16 DIAGNOSIS — J449 Chronic obstructive pulmonary disease, unspecified: Secondary | ICD-10-CM | POA: Insufficient documentation

## 2022-05-16 DIAGNOSIS — I10 Essential (primary) hypertension: Secondary | ICD-10-CM | POA: Diagnosis not present

## 2022-05-16 DIAGNOSIS — Z79899 Other long term (current) drug therapy: Secondary | ICD-10-CM | POA: Insufficient documentation

## 2022-05-16 DIAGNOSIS — M60872 Other myositis, left ankle and foot: Secondary | ICD-10-CM | POA: Diagnosis not present

## 2022-05-16 DIAGNOSIS — Z951 Presence of aortocoronary bypass graft: Secondary | ICD-10-CM | POA: Insufficient documentation

## 2022-05-16 DIAGNOSIS — M7989 Other specified soft tissue disorders: Secondary | ICD-10-CM | POA: Diagnosis not present

## 2022-05-16 DIAGNOSIS — I2510 Atherosclerotic heart disease of native coronary artery without angina pectoris: Secondary | ICD-10-CM | POA: Insufficient documentation

## 2022-05-16 DIAGNOSIS — R7401 Elevation of levels of liver transaminase levels: Secondary | ICD-10-CM | POA: Diagnosis not present

## 2022-05-16 DIAGNOSIS — M79672 Pain in left foot: Secondary | ICD-10-CM | POA: Insufficient documentation

## 2022-05-16 DIAGNOSIS — R600 Localized edema: Secondary | ICD-10-CM | POA: Diagnosis not present

## 2022-05-16 HISTORY — DX: Pain in left foot: M79.672

## 2022-05-16 LAB — COMPREHENSIVE METABOLIC PANEL
ALT: 33 U/L (ref 0–44)
AST: 21 U/L (ref 15–41)
Albumin: 3.1 g/dL — ABNORMAL LOW (ref 3.5–5.0)
Alkaline Phosphatase: 69 U/L (ref 38–126)
Anion gap: 11 (ref 5–15)
BUN: 17 mg/dL (ref 6–20)
CO2: 24 mmol/L (ref 22–32)
Calcium: 8.8 mg/dL — ABNORMAL LOW (ref 8.9–10.3)
Chloride: 100 mmol/L (ref 98–111)
Creatinine, Ser: 1.1 mg/dL (ref 0.61–1.24)
GFR, Estimated: >60 mL/min (ref >60)
Glucose, Bld: 275 mg/dL — ABNORMAL HIGH (ref 70–99)
Potassium: 4.1 mmol/L (ref 3.5–5.1)
Sodium: 135 mmol/L (ref 135–145)
Total Bilirubin: 0.5 mg/dL (ref 0.3–1.2)
Total Protein: 7.6 g/dL (ref 6.5–8.1)

## 2022-05-16 LAB — CBC WITH DIFFERENTIAL/PLATELET
Abs Immature Granulocytes: 0.1 10*3/uL — ABNORMAL HIGH (ref 0.00–0.07)
Basophils Absolute: 0.1 10*3/uL (ref 0.0–0.1)
Basophils Relative: 1 %
Eosinophils Absolute: 0.2 10*3/uL (ref 0.0–0.5)
Eosinophils Relative: 2 %
HCT: 46 % (ref 39.0–52.0)
Hemoglobin: 15.1 g/dL (ref 13.0–17.0)
Immature Granulocytes: 1 %
Lymphocytes Relative: 16 %
Lymphs Abs: 2.1 10*3/uL (ref 0.7–4.0)
MCH: 30.9 pg (ref 26.0–34.0)
MCHC: 32.8 g/dL (ref 30.0–36.0)
MCV: 94.1 fL (ref 80.0–100.0)
Monocytes Absolute: 0.9 10*3/uL (ref 0.1–1.0)
Monocytes Relative: 7 %
Neutro Abs: 9.9 10*3/uL — ABNORMAL HIGH (ref 1.7–7.7)
Neutrophils Relative %: 73 %
Platelets: 411 10*3/uL — ABNORMAL HIGH (ref 150–400)
RBC: 4.89 MIL/uL (ref 4.22–5.81)
RDW: 13.3 % (ref 11.5–15.5)
WBC: 13.3 10*3/uL — ABNORMAL HIGH (ref 4.0–10.5)
nRBC: 0 % (ref 0.0–0.2)

## 2022-05-16 LAB — SEDIMENTATION RATE: Sed Rate: 38 mm/hr — ABNORMAL HIGH (ref 0–16)

## 2022-05-16 LAB — LACTIC ACID, PLASMA: Lactic Acid, Venous: 3 mmol/L (ref 0.5–1.9)

## 2022-05-16 MED ORDER — LACTATED RINGERS IV BOLUS
1000.0000 mL | Freq: Once | INTRAVENOUS | Status: AC
  Administered 2022-05-16: 1000 mL via INTRAVENOUS

## 2022-05-16 MED ORDER — MORPHINE SULFATE (PF) 4 MG/ML IV SOLN
4.0000 mg | Freq: Once | INTRAVENOUS | Status: AC
  Administered 2022-05-16: 4 mg via INTRAVENOUS
  Filled 2022-05-16: qty 1

## 2022-05-16 NOTE — ED Provider Notes (Signed)
[facility]-EMERGENCY DEPT Provider Note   CSN: [id number] Arrival date & time: 05/16/22  1900     History {Add pertinent medical, surgical, social history, OB history to HPI:1} Chief Complaint  Patient presents with   Foot Pain    Eddie Black is a 61 y.o. male who presents with concern for 3 weeks of persistent left foot pain.  Patient states that he was initially very edematous and red with exquisite pain, was seen by PCP and prescribed prednisone and meloxicam with no relief.  Subsequently seen by podiatry.  Chart review podiatrist Dr. Freida Busman feels presentation is most consistent with inflammatory arthritis such as gout versus pseudogout, did not feel there was any evidence of infection at that time.  Was prescribed Norco which he states has only been helping minimally.  Per patient erythema and swelling has significantly improved from initial onset though he continues to have constant aching pain in the foot.  No fevers at home but did state that he has been having chest between feeling hot and cold at night which is atypical for him. States he was directed to the emergency department today by his PCP to rule out osteomyelitis.  Patient with type 2 diabetes, hyperlipidemia, hypertension, CAD status post CABG, and COPD.  He is not anticoagulated.  HPI     Home Medications Prior to Admission medications   Medication Sig Start Date End Date Taking? Authorizing Provider  aspirin EC 81 MG tablet Take 1 tablet (81 mg total) by mouth daily. 04/21/18   Kroeger, Ovidio Kin., PA-C  buPROPion (WELLBUTRIN SR) 150 MG 12 hr tablet Take 150 mg by mouth 2 (two) times daily. 07/28/18   [provider]  diclofenac (VOLTAREN) 75 MG EC tablet Take 75 mg by mouth 2 (two) times daily.    [provider]  enalapril (VASOTEC) 10 MG tablet Take 1 tablet (10 mg total) by mouth daily. 12/19/18   Georgie Chard D, NP  JARDIANCE 25 MG TABS tablet Take 25 mg by mouth  daily. 11/20/18   [provider]  levothyroxine (SYNTHROID, LEVOTHROID) 25 MCG tablet Take 25 mcg by mouth daily.    [provider]  metFORMIN (GLUCOPHAGE) 500 MG tablet Take 500 mg by mouth 2 (two) times daily with a meal.    [provider]  metoprolol succinate (TOPROL-XL) 25 MG 24 hr tablet Take 25 mg by mouth daily. 09/22/20   [provider]  olmesartan (BENICAR) 20 MG tablet Take 20 mg by mouth daily. 10/06/20   [provider]  Omega-3 Fatty Acids (FISH OIL TRIPLE STRENGTH PO) Take 1 capsule by mouth 2 (two) times daily.    [provider]  rosuvastatin (CRESTOR) 40 MG tablet Take 1 tablet (40 mg total) by mouth daily. Appointment needed for further refills 12/11/21   Jake Bathe, MD      Allergies    Patient has no known allergies.    Review of Systems   Review of Systems  Musculoskeletal:        Foot pain    Physical Exam Updated Vital Signs BP (!) 152/105 (BP Location: Left Arm)   Pulse (!) 106   Temp 98.5 F (36.9 C) (Oral)   Resp 18   Ht 5\' 8"  (1.727 m)   Wt 103.9 kg   SpO2 100%   BMI 34.82 kg/m  Physical Exam Vitals and nursing note reviewed.  Constitutional:      Appearance: He is not ill-appearing or toxic-appearing.  HENT:     Head: Normocephalic and atraumatic.     Mouth/Throat:     Mouth: Mucous membranes are moist.     Pharynx: No oropharyngeal exudate or posterior oropharyngeal erythema.  Eyes:     General:        Right eye: No discharge.        Left eye: No discharge.     Conjunctiva/sclera: Conjunctivae normal.  Cardiovascular:     Rate and Rhythm: Normal rate and regular rhythm.     Pulses: Normal pulses.     Heart sounds: Normal heart sounds. No murmur heard. Pulmonary:     Effort: Pulmonary effort is normal. No respiratory distress.     Breath sounds: Normal breath sounds. No wheezing or rales.  Abdominal:     General: Bowel sounds are normal. There is no distension.     Palpations:  Abdomen is soft.     Tenderness: There is no abdominal tenderness. There is no guarding or rebound.  Musculoskeletal:        General: No deformity.     Cervical back: Neck supple.     Right lower leg: No edema.     Left lower leg: No edema.       Feet:  Skin:    General: Skin is warm and dry.     Capillary Refill: Capillary refill takes less than 2 seconds.  Neurological:     General: No focal deficit present.     Mental Status: He is alert and oriented to person, place, and time. Mental status is at baseline.  Psychiatric:        Mood and Affect: Mood normal.     ED Results / Procedures / Treatments   Labs (all labs ordered are listed, but only abnormal results are displayed) Labs Reviewed  LACTIC ACID, PLASMA - Abnormal; Notable for the following components:      Result Value   Lactic Acid, Venous 3.0 (*)    All other components within normal limits  COMPREHENSIVE METABOLIC PANEL - Abnormal; Notable for the following components:   Glucose, Bld 275 (*)    Calcium 8.8 (*)    Albumin 3.1 (*)    All other components within normal limits  CBC WITH DIFFERENTIAL/PLATELET - Abnormal; Notable for the following components:   WBC 13.3 (*)    Platelets 411 (*)    Neutro Abs 9.9 (*)    Abs Immature Granulocytes 0.10 (*)    All other components within normal limits  SEDIMENTATION RATE - Abnormal; Notable for the following components:   Sed Rate 38 (*)    All other components within normal limits  LACTIC ACID, PLASMA  C-REACTIVE PROTEIN    EKG None  Radiology MR FOOT LEFT WO CONTRAST  Result Date: 05/16/2022 CLINICAL DATA:  Foot swelling, diabetic, osteomyelitis suspected. Radiographs dated May 08, 2022 EXAM: MRI OF THE LEFT FOOT WITHOUT CONTRAST TECHNIQUE: Multiplanar, multisequence MR imaging of the left forefoot was performed. No intravenous contrast was administered. COMPARISON:  Radiograph dated May 08, 2022 FINDINGS: Bones/Joint/Cartilage Heterogeneous marrow  signal of the tarsal bones and bases of the second third fourth and fifth metatarsals suggesting arthropathy likely secondary to diabetic neuropathy. No definite MR evidence of acute osteomyelitis. Degenerative changes of the interphalangeal joints of the second and third proximal interphalangeal joints. Ligaments Lisfranc and collateral ligaments are intact. 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 No fluid collection or abscess. Soft tissues Subcutaneous soft tissue edema  about the dorsum of the foot. No fluid collection or abscess. IMPRESSION: 1. Heterogeneous marrow signal of the tarsal bones and bases of the second through fifth metatarsals suggesting arthropathy likely secondary to diabetic neuropathy. No definite MR evidence of acute osteomyelitis. 2. 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 3. Subcutaneous soft tissue edema about the dorsum of the foot without fluid collection or abscess. Electronically Signed   By: Keane Police D.O.   On: 05/16/2022 21:14    Procedures Procedures  {Document cardiac monitor, telemetry assessment procedure when appropriate:1}  Medications Ordered in ED Medications - No data to display  ED Course/ Medical Decision Making/ A&P                           Medical Decision Making 61 year old male who presents with concern for persistent left foot pain x3 weeks, improving though still present.  Patient cannot take NSAIDs, minimal improvement with Norco at home.  Hypertensive and mildly tachycardic intake, heart rate normal at time of my evaluation.  Cardiopulmonary exam is normal, abdominal exam is benign.  Patient with normal neurovascular status in the left foot but tenderness palpation over the midfoot with no erythema and minimal soft tissue swelling.  Differential diagnosis includes but is limited to gout/pseudogout, cellulitis, osteomyelitis, deep  space infection with abscess.  Amount and/or Complexity of Data Reviewed Labs:     Details: CBC with mildly chills of 13,000, no anemia, CMP with hyperglycemia but otherwise unremarkable.  Sed rate mildly elevated to 38, lactic acid elevated to 3. Radiology:     Details: MRI left foot with heterogenous marrow signal in the bone suggesting diabetic neuropathy and increasing along plantar muscles suggesting myopathy/myositis.  No evidence of fluid collection or abscess, no osteomyelitis. visualized by this provider.    ***  {Document critical care time when appropriate:1} {Document review of labs and clinical decision tools ie heart score, Chads2Vasc2 etc:1}  {Document your independent review of radiology images, and any outside records:1} {Document your discussion with family members, caretakers, and with consultants:1} {Document social determinants of health affecting pt's care:1} {Document your decision making why or why not admission, treatments were needed:1} Final Clinical Impression(s) / ED Diagnoses Final diagnoses:  None    Rx / DC Orders ED Discharge Orders     None

## 2022-05-16 NOTE — ED Triage Notes (Addendum)
Pt c/o left foot pain and swelling x 3 weeks states pcp prescribed prednisone and meloxicam and has had no relief. Was sent from pcp to rule out osteomyelitis.

## 2022-05-16 NOTE — ED Provider Triage Note (Signed)
Emergency Medicine Provider Triage Evaluation Note  Eddie Black , a 61 y.o. male  was evaluated in triage.  Pt complains of left foot swelling x 3 weeks. Seen by PCP, had [place] and XR with labs. Saw foot and ankle yesterday, more Xrs, concern for osteomyelitis.  Sent to emerg ortho today- more Xrs sent to ER, Xrs do not show degeneration of bone Hx diabetes, on meds Review of Systems  Positive: As above Negative: As above  Physical Exam  BP (!) 158/118 (BP Location: Right Arm)   Pulse (!) 106   Temp 98 F (36.7 C) (Oral)   Resp 18   Ht 5\' 8"  (1.727 m)   Wt 103.9 kg   SpO2 100%   BMI 34.82 kg/m  Gen:   Awake, no distress   Resp:  Normal effort  MSK:   Moves extremities without difficulty  Other:  Left foot red, warm, TTP  Medical Decision Making  Medically screening exam initiated at 7:27 PM.  Appropriate orders placed.  Eddie Black was informed that the remainder of the evaluation will be completed by another provider, this initial triage assessment does not replace that evaluation, and the importance of remaining in the ED until their evaluation is complete.     Tacy Learn, PA-C 05/16/22 1928

## 2022-05-17 LAB — LACTIC ACID, PLASMA: Lactic Acid, Venous: 1.6 mmol/L (ref 0.5–1.9)

## 2022-05-17 LAB — C-REACTIVE PROTEIN: CRP: 9.7 mg/dL — ABNORMAL HIGH (ref <1.0)

## 2022-05-17 MED ORDER — OXYCODONE-ACETAMINOPHEN 5-325 MG PO TABS: 1.0000 | ORAL_TABLET | Freq: Four times a day (QID) | ORAL | 0 refills | Status: DC | PRN

## 2022-05-17 MED ORDER — SULFAMETHOXAZOLE-TRIMETHOPRIM 800-160 MG PO TABS: 1.0000 | ORAL_TABLET | Freq: Two times a day (BID) | ORAL | 0 refills | Status: AC

## 2022-05-17 MED ORDER — KETOROLAC TROMETHAMINE 15 MG/ML IJ SOLN
15.0000 mg | Freq: Once | INTRAMUSCULAR | Status: AC
  Administered 2022-05-17: 15 mg via INTRAVENOUS
  Filled 2022-05-17: qty 1

## 2022-05-17 NOTE — Discharge Instructions (Addendum)
You are seen here today for your foot pain.  Fortunately your MRI did not reveal any evidence of infection.  You have a myositis which is inflammation of the muscle.  You may take the prescribed pain medication.  Do not take the hydrocodone you are prescribed if you are going to take the new oxycodone prescription. You have been provided an antibiotic prescription.  If 48 hours from time of your discharge your pain or swelling is not improving you may begin to take the antibiotic prescription provided.  Please follow-up with your primary care doctor for trending of your inflammatory marker labs as these were elevated today.  Return to the ER with any new severe symptoms.

## 2022-05-21 ENCOUNTER — Other Ambulatory Visit: Payer: Self-pay | Admitting: Family Medicine

## 2022-05-21 DIAGNOSIS — M7989 Other specified soft tissue disorders: Secondary | ICD-10-CM

## 2022-05-21 DIAGNOSIS — M25572 Pain in left ankle and joints of left foot: Secondary | ICD-10-CM

## 2022-05-24 DIAGNOSIS — L03119 Cellulitis of unspecified part of limb: Secondary | ICD-10-CM | POA: Diagnosis not present

## 2022-05-24 DIAGNOSIS — E785 Hyperlipidemia, unspecified: Secondary | ICD-10-CM | POA: Diagnosis not present

## 2022-05-24 DIAGNOSIS — E1169 Type 2 diabetes mellitus with other specified complication: Secondary | ICD-10-CM | POA: Diagnosis not present

## 2022-05-24 DIAGNOSIS — I152 Hypertension secondary to endocrine disorders: Secondary | ICD-10-CM | POA: Diagnosis not present

## 2022-05-24 DIAGNOSIS — Z23 Encounter for immunization: Secondary | ICD-10-CM | POA: Diagnosis not present

## 2022-05-24 DIAGNOSIS — Z6832 Body mass index (BMI) 32.0-32.9, adult: Secondary | ICD-10-CM | POA: Diagnosis not present

## 2022-05-24 DIAGNOSIS — E1159 Type 2 diabetes mellitus with other circulatory complications: Secondary | ICD-10-CM | POA: Diagnosis not present

## 2022-06-08 DIAGNOSIS — M79673 Pain in unspecified foot: Secondary | ICD-10-CM | POA: Diagnosis not present

## 2022-06-08 DIAGNOSIS — Z6833 Body mass index (BMI) 33.0-33.9, adult: Secondary | ICD-10-CM | POA: Diagnosis not present

## 2022-06-08 DIAGNOSIS — E1140 Type 2 diabetes mellitus with diabetic neuropathy, unspecified: Secondary | ICD-10-CM | POA: Diagnosis not present

## 2022-06-12 DIAGNOSIS — Z6833 Body mass index (BMI) 33.0-33.9, adult: Secondary | ICD-10-CM | POA: Diagnosis not present

## 2022-06-12 DIAGNOSIS — R609 Edema, unspecified: Secondary | ICD-10-CM | POA: Diagnosis not present

## 2022-06-12 DIAGNOSIS — M79673 Pain in unspecified foot: Secondary | ICD-10-CM | POA: Diagnosis not present

## 2022-06-29 ENCOUNTER — Other Ambulatory Visit: Payer: Self-pay | Admitting: Cardiology

## 2022-06-29 DIAGNOSIS — Z79899 Other long term (current) drug therapy: Secondary | ICD-10-CM

## 2022-06-29 DIAGNOSIS — R7401 Elevation of levels of liver transaminase levels: Secondary | ICD-10-CM

## 2022-06-29 DIAGNOSIS — Z951 Presence of aortocoronary bypass graft: Secondary | ICD-10-CM

## 2022-06-29 DIAGNOSIS — E785 Hyperlipidemia, unspecified: Secondary | ICD-10-CM

## 2022-08-14 ENCOUNTER — Other Ambulatory Visit: Payer: Self-pay | Admitting: Cardiology

## 2022-08-14 DIAGNOSIS — Z951 Presence of aortocoronary bypass graft: Secondary | ICD-10-CM

## 2022-08-14 DIAGNOSIS — E785 Hyperlipidemia, unspecified: Secondary | ICD-10-CM

## 2022-08-14 DIAGNOSIS — Z79899 Other long term (current) drug therapy: Secondary | ICD-10-CM

## 2022-08-14 DIAGNOSIS — R7401 Elevation of levels of liver transaminase levels: Secondary | ICD-10-CM

## 2022-08-30 DIAGNOSIS — E1169 Type 2 diabetes mellitus with other specified complication: Secondary | ICD-10-CM | POA: Diagnosis not present

## 2022-08-30 DIAGNOSIS — E1159 Type 2 diabetes mellitus with other circulatory complications: Secondary | ICD-10-CM | POA: Diagnosis not present

## 2022-08-30 DIAGNOSIS — F17200 Nicotine dependence, unspecified, uncomplicated: Secondary | ICD-10-CM | POA: Diagnosis not present

## 2022-08-30 DIAGNOSIS — Z6830 Body mass index (BMI) 30.0-30.9, adult: Secondary | ICD-10-CM | POA: Diagnosis not present

## 2022-08-30 DIAGNOSIS — F17210 Nicotine dependence, cigarettes, uncomplicated: Secondary | ICD-10-CM | POA: Diagnosis not present

## 2022-08-30 DIAGNOSIS — E785 Hyperlipidemia, unspecified: Secondary | ICD-10-CM | POA: Diagnosis not present

## 2022-08-30 DIAGNOSIS — I152 Hypertension secondary to endocrine disorders: Secondary | ICD-10-CM | POA: Diagnosis not present

## 2022-08-30 DIAGNOSIS — Z23 Encounter for immunization: Secondary | ICD-10-CM | POA: Diagnosis not present

## 2022-10-05 DIAGNOSIS — M14672 Charcot's joint, left ankle and foot: Secondary | ICD-10-CM | POA: Diagnosis not present

## 2022-10-05 DIAGNOSIS — M14679 Charcot's joint, unspecified ankle and foot: Secondary | ICD-10-CM | POA: Insufficient documentation

## 2022-10-05 DIAGNOSIS — M25572 Pain in left ankle and joints of left foot: Secondary | ICD-10-CM | POA: Insufficient documentation

## 2022-10-05 HISTORY — DX: Pain in left ankle and joints of left foot: M25.572

## 2022-10-05 HISTORY — DX: Charcot's joint, unspecified ankle and foot: M14.679

## 2022-10-11 DIAGNOSIS — M25572 Pain in left ankle and joints of left foot: Secondary | ICD-10-CM | POA: Diagnosis not present

## 2022-10-11 DIAGNOSIS — M79672 Pain in left foot: Secondary | ICD-10-CM | POA: Diagnosis not present

## 2022-10-12 ENCOUNTER — Other Ambulatory Visit: Payer: Self-pay

## 2022-10-12 ENCOUNTER — Encounter (HOSPITAL_COMMUNITY): Payer: Self-pay | Admitting: Emergency Medicine

## 2022-10-12 ENCOUNTER — Inpatient Hospital Stay (HOSPITAL_COMMUNITY)
Admission: EM | Admit: 2022-10-12 | Discharge: 2022-10-13 | DRG: 638 | Disposition: A | Discharge status: Home / Self Care | Payer: BC Managed Care – PPO | Attending: Internal Medicine | Admitting: Internal Medicine

## 2022-10-12 DIAGNOSIS — Z8674 Personal history of sudden cardiac arrest: Secondary | ICD-10-CM

## 2022-10-12 DIAGNOSIS — E1169 Type 2 diabetes mellitus with other specified complication: Secondary | ICD-10-CM | POA: Diagnosis not present

## 2022-10-12 DIAGNOSIS — Z8679 Personal history of other diseases of the circulatory system: Secondary | ICD-10-CM | POA: Diagnosis not present

## 2022-10-12 DIAGNOSIS — Z7982 Long term (current) use of aspirin: Secondary | ICD-10-CM

## 2022-10-12 DIAGNOSIS — L02612 Cutaneous abscess of left foot: Secondary | ICD-10-CM | POA: Diagnosis present

## 2022-10-12 DIAGNOSIS — Z79899 Other long term (current) drug therapy: Secondary | ICD-10-CM | POA: Diagnosis not present

## 2022-10-12 DIAGNOSIS — M86272 Subacute osteomyelitis, left ankle and foot: Secondary | ICD-10-CM | POA: Diagnosis not present

## 2022-10-12 DIAGNOSIS — F17290 Nicotine dependence, other tobacco product, uncomplicated: Secondary | ICD-10-CM | POA: Diagnosis present

## 2022-10-12 DIAGNOSIS — I10 Essential (primary) hypertension: Secondary | ICD-10-CM | POA: Diagnosis not present

## 2022-10-12 DIAGNOSIS — M869 Osteomyelitis, unspecified: Secondary | ICD-10-CM

## 2022-10-12 DIAGNOSIS — E119 Type 2 diabetes mellitus without complications: Secondary | ICD-10-CM | POA: Diagnosis not present

## 2022-10-12 DIAGNOSIS — M86172 Other acute osteomyelitis, left ankle and foot: Secondary | ICD-10-CM | POA: Diagnosis present

## 2022-10-12 DIAGNOSIS — J449 Chronic obstructive pulmonary disease, unspecified: Secondary | ICD-10-CM | POA: Diagnosis present

## 2022-10-12 DIAGNOSIS — Z791 Long term (current) use of non-steroidal anti-inflammatories (NSAID): Secondary | ICD-10-CM

## 2022-10-12 DIAGNOSIS — Z7984 Long term (current) use of oral hypoglycemic drugs: Secondary | ICD-10-CM

## 2022-10-12 DIAGNOSIS — I2510 Atherosclerotic heart disease of native coronary artery without angina pectoris: Secondary | ICD-10-CM | POA: Diagnosis present

## 2022-10-12 DIAGNOSIS — M86672 Other chronic osteomyelitis, left ankle and foot: Secondary | ICD-10-CM | POA: Diagnosis present

## 2022-10-12 DIAGNOSIS — Z951 Presence of aortocoronary bypass graft: Secondary | ICD-10-CM

## 2022-10-12 DIAGNOSIS — Z96641 Presence of right artificial hip joint: Secondary | ICD-10-CM | POA: Diagnosis not present

## 2022-10-12 DIAGNOSIS — Z79890 Hormone replacement therapy (postmenopausal): Secondary | ICD-10-CM

## 2022-10-12 DIAGNOSIS — Z794 Long term (current) use of insulin: Secondary | ICD-10-CM | POA: Diagnosis not present

## 2022-10-12 DIAGNOSIS — I252 Old myocardial infarction: Secondary | ICD-10-CM | POA: Diagnosis not present

## 2022-10-12 DIAGNOSIS — E785 Hyperlipidemia, unspecified: Secondary | ICD-10-CM | POA: Diagnosis present

## 2022-10-12 HISTORY — DX: Acute respiratory failure with hypoxia: J96.01

## 2022-10-12 HISTORY — DX: Non-ST elevation (NSTEMI) myocardial infarction: I21.4

## 2022-10-12 HISTORY — DX: Chronic obstructive pulmonary disease with (acute) exacerbation: J44.1

## 2022-10-12 HISTORY — DX: Personal history of other diseases of the circulatory system: Z86.79

## 2022-10-12 HISTORY — DX: Osteomyelitis, unspecified: M86.9

## 2022-10-12 LAB — CBC WITH DIFFERENTIAL/PLATELET
Abs Immature Granulocytes: 0.02 10*3/uL (ref 0.00–0.07)
Basophils Absolute: 0.1 10*3/uL (ref 0.0–0.1)
Basophils Relative: 1 %
Eosinophils Absolute: 0.3 10*3/uL (ref 0.0–0.5)
Eosinophils Relative: 3 %
HCT: 44.8 % (ref 39.0–52.0)
Hemoglobin: 14.3 g/dL (ref 13.0–17.0)
Immature Granulocytes: 0 %
Lymphocytes Relative: 30 %
Lymphs Abs: 3.1 10*3/uL (ref 0.7–4.0)
MCH: 28.5 pg (ref 26.0–34.0)
MCHC: 31.9 g/dL (ref 30.0–36.0)
MCV: 89.4 fL (ref 80.0–100.0)
Monocytes Absolute: 0.7 10*3/uL (ref 0.1–1.0)
Monocytes Relative: 7 %
Neutro Abs: 6.4 10*3/uL (ref 1.7–7.7)
Neutrophils Relative %: 59 %
Platelets: 380 10*3/uL (ref 150–400)
RBC: 5.01 MIL/uL (ref 4.22–5.81)
RDW: 13.9 % (ref 11.5–15.5)
WBC: 10.6 10*3/uL — ABNORMAL HIGH (ref 4.0–10.5)
nRBC: 0 % (ref 0.0–0.2)

## 2022-10-12 LAB — COMPREHENSIVE METABOLIC PANEL
ALT: 16 U/L (ref 0–44)
AST: 17 U/L (ref 15–41)
Albumin: 3.9 g/dL (ref 3.5–5.0)
Alkaline Phosphatase: 86 U/L (ref 38–126)
Anion gap: 6 (ref 5–15)
BUN: 8 mg/dL (ref 6–20)
CO2: 24 mmol/L (ref 22–32)
Calcium: 8.9 mg/dL (ref 8.9–10.3)
Chloride: 107 mmol/L (ref 98–111)
Creatinine, Ser: 0.73 mg/dL (ref 0.61–1.24)
GFR, Estimated: >60 mL/min (ref >60)
Glucose, Bld: 84 mg/dL (ref 70–99)
Potassium: 3.3 mmol/L — ABNORMAL LOW (ref 3.5–5.1)
Sodium: 137 mmol/L (ref 135–145)
Total Bilirubin: 0.7 mg/dL (ref 0.3–1.2)
Total Protein: 7.5 g/dL (ref 6.5–8.1)

## 2022-10-12 LAB — LACTIC ACID, PLASMA: Lactic Acid, Venous: 0.8 mmol/L (ref 0.5–1.9)

## 2022-10-12 LAB — SEDIMENTATION RATE: Sed Rate: 7 mm/hr (ref 0–16)

## 2022-10-12 MED ORDER — METRONIDAZOLE 500 MG/100ML IV SOLN: 500.0000 mg | Freq: Two times a day (BID) | INTRAVENOUS | Status: DC

## 2022-10-12 MED ORDER — METRONIDAZOLE 500 MG/100ML IV SOLN
500.0000 mg | Freq: Two times a day (BID) | INTRAVENOUS | Status: DC
  Administered 2022-10-12: 500 mg via INTRAVENOUS
  Filled 2022-10-12: qty 100

## 2022-10-12 MED ORDER — VANCOMYCIN HCL 2000 MG/400ML IV SOLN
2000.0000 mg | Freq: Once | INTRAVENOUS | Status: DC
  Filled 2022-10-12: qty 400

## 2022-10-12 MED ORDER — POTASSIUM CHLORIDE CRYS ER 20 MEQ PO TBCR
40.0000 meq | EXTENDED_RELEASE_TABLET | Freq: Once | ORAL | Status: AC
  Administered 2022-10-12: 40 meq via ORAL
  Filled 2022-10-12: qty 2

## 2022-10-12 MED ORDER — SODIUM CHLORIDE 0.9 % IV SOLN: 2.0000 g | INTRAVENOUS | Status: DC

## 2022-10-12 MED ORDER — SODIUM CHLORIDE 0.9 % IV SOLN
2.0000 g | INTRAVENOUS | Status: DC
  Administered 2022-10-12: 2 g via INTRAVENOUS
  Filled 2022-10-12: qty 20

## 2022-10-12 NOTE — Assessment & Plan Note (Signed)
Continue toprol-xl

## 2022-10-12 NOTE — Progress Notes (Signed)
A consult was received from an ED physician for vanc per pharmacy dosing.  The patient's profile has been reviewed for ht/wt/allergies/indication/available labs.   A one time order has been placed for vanc 2g.  Further antibiotics/pharmacy consults should be ordered by admitting physician if indicated.                       Thank you, Kara Mead 10/12/2022  7:20 PM

## 2022-10-12 NOTE — Progress Notes (Signed)
Patient ID: Matheus Sporn, male   DOB: 18-Apr-1961, 62 y.o.   MRN: [id number]  Spoke with Dr. Kathaleen Bury who was the [facility] Physician that ordered MRI of foot and ankle. He is recommended Medical admission with PICC line placement and abx for 6 weeks per ID directive. He does not think that surgical management is needed at this time but if patient is unresponsive to IV abx then maybe a consideration. He will continue to follow patient while he is in hospital.  ATTENDING ADDENDUM  Patient currently admitted. Outpatient MRI with contrast performed because x-ray showed interim lucency over lateral 4th and 5th metatarsal bases with bony destruction. Reviewed imaging with interpreting radiologist who is concerned for acute osteomyelitis perhaps superimposed on Charcot. Plan for left foot bone biopsy on Monday and will manage as acute on chronic osteo until/unless proven otherwise. Please keep NPO from MN.  Full consult note to follow.  Armond Hang, MD Orthopaedic Surgery [facility]

## 2022-10-12 NOTE — Assessment & Plan Note (Signed)
Due to AMI.

## 2022-10-12 NOTE — Assessment & Plan Note (Signed)
Continue crestor 40mg daily. 

## 2022-10-12 NOTE — ED Triage Notes (Signed)
Patient arrives ambulatory by POV with wife. Patient had MRI of left foot yesterday and was told he has osteomyelitis. Patient has been having issues with left foot since October.

## 2022-10-12 NOTE — Assessment & Plan Note (Addendum)
Observation med/surg bed. Pt needs bone biopsy and culture taken prior to committing him to 6 weeks of abx therapy. Pt's left foot pain has been ongoing for 6 months in duration.  Given the fact that he has had pain in his left foot for 6 months time now, he has no open sores or wounds, has a normal white count and is not febrile, this workup not emergent.  However given that the patient and his wife are extremely frustrated at the lack of progress and diagnosis of the patient's left foot pain, we will go ahead and observe him in the hospital overnight.  ID consulted(wallace). Will also need the records of his outpatient workup including his MRI foot results and images. Keep pt NPO so he can get his bone biopsy and cultures. Pt does not need to stay in the hospital while culture results are pending. Prn toradol for moderate pain. Norco for severe pain  Check uric acid, esr, crp

## 2022-10-12 NOTE — Assessment & Plan Note (Signed)
Stable. Continue jardiance 25 mg daily. Check A1c.

## 2022-10-12 NOTE — H&P (Signed)
History and Physical    Eddie Black [id number] DOB: [birth year] DOA: 10/12/2022  DOS: the patient was seen and examined on 10/12/2022  PCP: Marco Collie, MD   Patient coming from: Home  I have personally briefly reviewed patient's old medical records in [place]  CC: left foot pain, osteomyelitis HPI: 62 year old Caucasian male history of type 2 diabetes, hypertension, history of coronary status post CABG, hyperlipidemia who has been dealing with left foot pain for the last 6 months.  He has been seen multiple times by primary care, podiatry, here in the ER and orthopedics.  Initial story is back in October 2023 has had left foot pain and swelling.  There is no injury.  He had no open wounds.  Patient had workup for gout with a normal uric acid.  He being given a round of prednisone by primary care which did not help.  He is subsequently referred to to podiatry.  X-rays were performed which did not show anything.  He was subsequently seen in the emergency department in October 2023.  Consideration for inflammatory arthritis such as gout or pseudogout was entertained.  He had an MRI in the ER which demonstrated some heterogeneous bone marrow signal of the tarsal bones and base of the left second through fifth metatarsal suggesting arthropathy but no evidence of osteomyelitis.  Since then, patient's been back in forth between podiatry and primary care without resolution of his pain.  He states that his his foot is painful when he is walking.  Has been placed in a walking boot with some relief.  He went back and saw orthopedics.  He underwent MRI scanning 2 days ago.  He just got the report today.  Reportedly he had evidence of osteomyelitis.  Patient has never had any bone biopsies or cultures of this area performed.  He was sent to the ER by orthopedics to get a PICC line and IV antibiotics.  Tried hospitalist contacted for admission.   ED Course: WBC 10.3  Review of Systems:   Review of Systems  Constitutional: Negative.  Negative for chills and fever.  HENT: Negative.    Eyes: Negative.   Respiratory: Negative.    Cardiovascular: Negative.   Gastrointestinal: Negative.   Genitourinary: Negative.   Musculoskeletal:        Left foot pain. Mostly with walking  Skin: Negative.        No open sores or wounds on his left foot  Neurological: Negative.   Endo/Heme/Allergies: Negative.   Psychiatric/Behavioral: Negative.    All other systems reviewed and are negative.   Past Medical History:  Diagnosis Date   Acute hypoxemic respiratory failure (HCC)    Cardiac arrest ([place]) 10/25/2017   COPD exacerbation (HCC)    Diabetes mellitus ([place])    Hyperlipemia    Hypertension    MI, acute, non ST segment elevation ([place])    S/P hip replacement, right 07/18/2021   Tobacco abuse    Ventricular fibrillation ([place]) 10/25/2017    Past Surgical History:  Procedure Laterality Date   CORONARY ARTERY BYPASS GRAFT N/A 10/25/2017   Procedure: CORONARY ARTERY BYPASS GRAFTING (CABG) times three, WITH ENDOSCOPIC HARVESTING OF RIGHT SAPHENOUS VEIN;  Surgeon: Ivin Poot, MD;  Location: [place];  Service: Open Heart Surgery;  Laterality: N/A;   IABP INSERTION N/A 10/25/2017   Procedure: IABP Insertion;  Surgeon: Belva Crome, MD;  Location: [place] [facility];  Service: Cardiovascular;  Laterality: N/A;   LEFT HEART CATH AND  CORONARY ANGIOGRAPHY N/A 10/25/2017   Procedure: LEFT HEART CATH AND CORONARY ANGIOGRAPHY;  Surgeon: Belva Crome, MD;  Location: [place] [facility];  Service: Cardiovascular;  Laterality: N/A;   TEE WITHOUT CARDIOVERSION N/A 10/25/2017   Procedure: TRANSESOPHAGEAL ECHOCARDIOGRAM (TEE);  Surgeon: Prescott Gum, Collier Salina, MD;  Location: [place];  Service: Open Heart Surgery;  Laterality: N/A;     reports that he has quit smoking. He has never used smokeless tobacco. He reports current alcohol use. He reports current drug use. Drug: Marijuana.  No Known  Allergies  Family History  Family history unknown: Yes    Prior to Admission medications   Medication Sig Start Date End Date Taking? Authorizing Provider  aspirin EC 81 MG tablet Take 1 tablet (81 mg total) by mouth daily. 04/21/18   Kroeger, Lorelee Cover., PA-C  buPROPion (WELLBUTRIN SR) 150 MG 12 hr tablet Take 150 mg by mouth 2 (two) times daily. 07/28/18   [provider]  diclofenac (VOLTAREN) 75 MG EC tablet Take 75 mg by mouth 2 (two) times daily.    [provider]  enalapril (VASOTEC) 10 MG tablet Take 1 tablet (10 mg total) by mouth daily. 12/19/18   Kathyrn Drown D, NP  ezetimibe (ZETIA) 10 MG tablet Take 10 mg by mouth daily. 05/12/22   [provider]  hydrOXYzine (ATARAX) 25 MG tablet Take 25 mg by mouth every 6 (six) hours as needed. 05/01/22   [provider]  JARDIANCE 25 MG TABS tablet Take 25 mg by mouth daily. 11/20/18   [provider]  levothyroxine (SYNTHROID, LEVOTHROID) 25 MCG tablet Take 25 mcg by mouth daily.    [provider]  meloxicam (MOBIC) 15 MG tablet Take 15 mg by mouth daily. 05/11/22   [provider]  metFORMIN (GLUCOPHAGE) 500 MG tablet Take 500 mg by mouth 2 (two) times daily with a meal.    [provider]  metoprolol succinate (TOPROL-XL) 25 MG 24 hr tablet Take 25 mg by mouth daily. 09/22/20   [provider]  olmesartan (BENICAR) 20 MG tablet Take 20 mg by mouth daily. 10/06/20   [provider]  Omega-3 Fatty Acids (FISH OIL TRIPLE STRENGTH PO) Take 1 capsule by mouth 2 (two) times daily.    [provider]  oxyCODONE-acetaminophen (PERCOCET/ROXICET) 5-325 MG tablet Take 1 tablet by mouth every 6 (six) hours as needed for severe pain. 05/17/22   Sponseller, Eugene Garnet R, PA-C  rosuvastatin (CRESTOR) 40 MG tablet Take 1 tablet (40 mg total) by mouth daily. 08/15/22   Jerline Pain, MD    Physical Exam: Vitals:   10/12/22 1844 10/12/22 1845 10/12/22 2100  10/12/22 2200  BP: (!) 129/91  (!) 154/90 (!) 141/92  Pulse: 76  83 74  Resp: 18   18  Temp: 98.9 F (37.2 C)     TempSrc: Oral     SpO2: 100%  96% 95%  Weight:  88 kg    Height:  5\' 8"  (1.727 m)      Physical Exam Vitals and nursing note reviewed.  Constitutional:      General: He is not in acute distress.    Appearance: Normal appearance. He is normal weight. He is not ill-appearing, toxic-appearing or diaphoretic.  HENT:     Head: Normocephalic and atraumatic.     Nose: Nose normal.  Eyes:     General: No scleral icterus. Cardiovascular:     Rate and Rhythm: Normal rate and regular rhythm.  Pulses: Normal pulses.  Pulmonary:     Effort: Pulmonary effort is normal.     Breath sounds: Normal breath sounds.  Abdominal:     General: Abdomen is flat. Bowel sounds are normal. There is no distension.     Tenderness: There is no abdominal tenderness.  Musculoskeletal:       Feet:     Comments: No open sores or wounds on left foot(dorsal and plantar) surface.  Skin:    General: Skin is warm and dry.     Capillary Refill: Capillary refill takes less than 2 seconds.  Neurological:     General: No focal deficit present.     Mental Status: He is alert and oriented to person, place, and time.      Labs on Admission: I have personally reviewed following labs and imaging studies  CBC: Recent Labs  Lab 10/12/22 2007  WBC 10.6*  NEUTROABS 6.4  HGB 14.3  HCT 44.8  MCV 89.4  PLT 123XX123   Basic Metabolic Panel: Recent Labs  Lab 10/12/22 2007  NA 137  K 3.3*  CL 107  CO2 24  GLUCOSE 84  BUN 8  CREATININE 0.73  CALCIUM 8.9   GFR: Estimated Creatinine Clearance: 107.2 mL/min (by C-G formula based on SCr of 0.73 mg/dL). Liver Function Tests: Recent Labs  Lab 10/12/22 2007  AST 17  ALT 16  ALKPHOS 86  BILITOT 0.7  PROT 7.5  ALBUMIN 3.9   Radiological Exams on Admission: I have personally reviewed images No results found.  EKG: My personal interpretation  of EKG shows: no EKG  Assessment/Plan Principal Problem:   Foot osteomyelitis, left ([place]) Active Problems:   Diabetes mellitus type II, controlled ([place])   Hyperlipidemia LDL goal <70   Essential hypertension   S/P CABG x 3   History of ventricular fibrillation   Assessment and Plan: * Foot osteomyelitis, left ([place]) Observation med/surg bed. Pt needs bone biopsy and culture taken prior to committing him to 6 weeks of abx therapy. Pt's left foot pain has been ongoing for 6 months in duration.  Would be inappropriate to start 6 weeks of antibiotics without culture data when there is potential to obtain cultures and biopsy.  Given the fact that he has had pain in his left foot for 6 months time now, he has no open sores or wounds, has a normal white count and is not febrile, this workup not emergent or urgent.  However given that the patient and his wife are extremely frustrated at the lack of progress and diagnosis of the patient's left foot pain, we will go ahead and observe him in the hospital overnight.  ID consulted(wallace). Will also need the records of his outpatient workup including his MRI foot results and images. Keep pt NPO so he can get his bone biopsy and cultures. Pt does not need to stay in the hospital while culture results are pending. Prn toradol for moderate pain. Norco for severe pain  Check uric acid, esr, crp  History of ventricular fibrillation Due to AMI.  S/P CABG x 3 Pt no longer on ASA per wife.  Essential hypertension Continue toprol-xl  Hyperlipidemia LDL goal <70 Continue crestor 40 mg daily.  Diabetes mellitus type II, controlled ([place]) Stable. Continue jardiance 25 mg daily. Check A1c.   DVT prophylaxis: SCDs Code Status: Full Code Family Communication: discussed with pt and wife(paula) at bedside Disposition Plan: return home  Consults called: called Haus, PA([facility])  Admission status: Observation, Med-Surg  Kristopher Oppenheim, DO [facility] 10/12/2022, 10:34 PM

## 2022-10-12 NOTE — Subjective & Objective (Addendum)
CC: left foot pain, osteomyelitis HPI: 62 year old Caucasian male history of type 2 diabetes, hypertension, history of coronary status post CABG, hyperlipidemia who has been dealing with left foot pain for the last 6 months.  He has been seen multiple times by primary care, podiatry, here in the ER and orthopedics.  Initial story is back in October 2023 has had left foot pain and swelling.  There is no injury.  He had no open wounds.  Patient had workup for gout with a normal uric acid.  He being given a round of prednisone by primary care which did not help.  He is subsequently referred to to podiatry.  X-rays were performed which did not show anything.  He was subsequently seen in the emergency department in October 2023.  Consideration for inflammatory arthritis such as gout or pseudogout was entertained.  He had an MRI in the ER which demonstrated some heterogeneous bone marrow signal of the tarsal bones and base of the left second through fifth metatarsal suggesting arthropathy but no evidence of osteomyelitis.  Since then, patient's been back in forth between podiatry and primary care without resolution of his pain.  He states that his his foot is painful when he is walking.  Has been placed in a walking boot with some relief.  He went back and saw orthopedics.  He underwent MRI scanning 2 days ago.  He just got the report today.  Reportedly he had evidence of osteomyelitis.  Patient has never had any bone biopsies or cultures of this area performed.  He was sent to the ER by orthopedics to get a PICC line and IV antibiotics.  Tried hospitalist contacted for admission.

## 2022-10-12 NOTE — Assessment & Plan Note (Signed)
Pt no longer on ASA per wife.

## 2022-10-12 NOTE — ED Provider Notes (Signed)
[place] AT [facility] Provider Note   CSN: [id number] Arrival date & time: 10/12/22  1821     History  Chief Complaint  Patient presents with   Osteomyelitis     Eddie Black is a 62 y.o. male presenting with to ED with left foot infection.  Reports he had MRI performed at [facility] today and was told to come to the ED immediately because he had "osteomyelitis" in his left foot.  He denies fevers, chills, rash, or ulcers on his foot.  He is diabetic and gets weekly injections but denies  [facility] by phone confirms results with me,   MRI foot and ankle without contrast  Extensive bone marrow edema w/ cuboid abscess and 4th and 5th metatarsal basis  HPI     Home Medications Prior to Admission medications   Medication Sig Start Date End Date Taking? Authorizing Provider  aspirin EC 81 MG tablet Take 1 tablet (81 mg total) by mouth daily. 04/21/18   Kroeger, Lorelee Cover., PA-C  buPROPion (WELLBUTRIN SR) 150 MG 12 hr tablet Take 150 mg by mouth 2 (two) times daily. 07/28/18   [provider]  diclofenac (VOLTAREN) 75 MG EC tablet Take 75 mg by mouth 2 (two) times daily.    [provider]  enalapril (VASOTEC) 10 MG tablet Take 1 tablet (10 mg total) by mouth daily. 12/19/18   Kathyrn Drown D, NP  ezetimibe (ZETIA) 10 MG tablet Take 10 mg by mouth daily. 05/12/22   [provider]  hydrOXYzine (ATARAX) 25 MG tablet Take 25 mg by mouth every 6 (six) hours as needed. 05/01/22   [provider]  JARDIANCE 25 MG TABS tablet Take 25 mg by mouth daily. 11/20/18   [provider]  levothyroxine (SYNTHROID, LEVOTHROID) 25 MCG tablet Take 25 mcg by mouth daily.    [provider]  meloxicam (MOBIC) 15 MG tablet Take 15 mg by mouth daily. 05/11/22   [provider]  metFORMIN (GLUCOPHAGE) 500 MG tablet Take 500 mg by mouth 2 (two) times daily with a meal.    [provider]   metoprolol succinate (TOPROL-XL) 25 MG 24 hr tablet Take 25 mg by mouth daily. 09/22/20   [provider]  olmesartan (BENICAR) 20 MG tablet Take 20 mg by mouth daily. 10/06/20   [provider]  Omega-3 Fatty Acids (FISH OIL TRIPLE STRENGTH PO) Take 1 capsule by mouth 2 (two) times daily.    [provider]  oxyCODONE-acetaminophen (PERCOCET/ROXICET) 5-325 MG tablet Take 1 tablet by mouth every 6 (six) hours as needed for severe pain. 05/17/22   Sponseller, Eugene Garnet R, PA-C  rosuvastatin (CRESTOR) 40 MG tablet Take 1 tablet (40 mg total) by mouth daily. 08/15/22   Jerline Pain, MD      Allergies    Patient has no known allergies.    Review of Systems   Review of Systems  Physical Exam Updated Vital Signs BP (!) 129/91 (BP Location: Left Arm)   Pulse 76   Temp 98.9 F (37.2 C) (Oral)   Resp 18   Ht 5\' 8"  (1.727 m)   Wt 88 kg   SpO2 100%   BMI 29.50 kg/m  Physical Exam Constitutional:      General: He is not in acute distress. HENT:     Head: Normocephalic and atraumatic.  Eyes:     Conjunctiva/sclera: Conjunctivae normal.     Pupils: Pupils are equal, round,  and reactive to light.  Cardiovascular:     Rate and Rhythm: Normal rate and regular rhythm.  Pulmonary:     Effort: Pulmonary effort is normal. No respiratory distress.  Abdominal:     General: There is no distension.  Musculoskeletal:     Comments: Mild tenderness over the proximal foot, dorsal aspect, without any visible ulcerations of the foot, or erythema or streaking up the leg.  Mild edema of the left foot.  Skin:    General: Skin is warm and dry.  Neurological:     General: No focal deficit present.     Mental Status: He is alert. Mental status is at baseline.  Psychiatric:        Mood and Affect: Mood normal.        Behavior: Behavior normal.     ED Results / Procedures / Treatments   Labs (all labs ordered are listed, but only abnormal results are displayed) Labs Reviewed   COMPREHENSIVE METABOLIC PANEL - Abnormal; Notable for the following components:      Result Value   Potassium 3.3 (*)    All other components within normal limits  CBC WITH DIFFERENTIAL/PLATELET - Abnormal; Notable for the following components:   WBC 10.6 (*)    All other components within normal limits  CULTURE, BLOOD (ROUTINE X 2)  CULTURE, BLOOD (ROUTINE X 2)  LACTIC ACID, PLASMA    EKG None  Radiology No results found.  Procedures Procedures    Medications Ordered in ED Medications  cefTRIAXone (ROCEPHIN) 2 g in sodium chloride 0.9 % 100 mL IVPB (2 g Intravenous New Bag/Given 10/12/22 2000)    And  metroNIDAZOLE (FLAGYL) IVPB 500 mg (500 mg Intravenous New Bag/Given 10/12/22 2058)  vancomycin (VANCOREADY) IVPB 2000 mg/400 mL (has no administration in time range)    ED Course/ Medical Decision Making/ A&P                             Medical Decision Making Amount and/or Complexity of Data Reviewed Labs: ordered.  Risk Prescription drug management. Decision regarding hospitalization.   Patient presents to the ED with outpatient imaging today at his orthopedic clinic concerning for osteomyelitis of the midfoot, the fourth fifth metatarsal and the cuboid bone, with potential abscess as well.  I do not see evidence of necrotizing fasciitis on exam.  He does not show signs of sepsis.  I have ordered basic labs and blood cultures given the concerns for bone infection, I would anticipate medical admission to the hospital and IV antibiotics.  I reached out to [facility] by phone to confirm the results of the MRI scan performed today, as well as plan for medical admission.  She will update the chart or provide further information regarding orthopedic plans, if any, for operative washout.  Otherwise the patient would be anticipated as medical admission for IV antibiotics.  I reviewed the patient's labs today which did not show any emergent findings.  White blood  cell count and lactate within normal limits.  I have a low suspicion for sepsis at this time.  I spoke again to [place] to request that she contact her office to determine how to upload the MRI report or transfer the images for medical records.  She reports her team will also consult on the patient in hospital.        Final Clinical Impression(s) / ED Diagnoses Final diagnoses:  Osteomyelitis of  left foot, unspecified type [facility])    Rx / DC Orders ED Discharge Orders     None         Wyvonnia Dusky, MD 10/12/22 2108

## 2022-10-13 ENCOUNTER — Encounter: Payer: Self-pay | Admitting: Internal Medicine

## 2022-10-13 DIAGNOSIS — Z794 Long term (current) use of insulin: Secondary | ICD-10-CM | POA: Diagnosis not present

## 2022-10-13 DIAGNOSIS — I10 Essential (primary) hypertension: Secondary | ICD-10-CM | POA: Diagnosis present

## 2022-10-13 DIAGNOSIS — L02612 Cutaneous abscess of left foot: Secondary | ICD-10-CM | POA: Diagnosis present

## 2022-10-13 DIAGNOSIS — M869 Osteomyelitis, unspecified: Secondary | ICD-10-CM | POA: Diagnosis present

## 2022-10-13 DIAGNOSIS — E1169 Type 2 diabetes mellitus with other specified complication: Secondary | ICD-10-CM | POA: Diagnosis not present

## 2022-10-13 DIAGNOSIS — I2510 Atherosclerotic heart disease of native coronary artery without angina pectoris: Secondary | ICD-10-CM | POA: Diagnosis present

## 2022-10-13 DIAGNOSIS — Z96641 Presence of right artificial hip joint: Secondary | ICD-10-CM | POA: Diagnosis present

## 2022-10-13 DIAGNOSIS — Z951 Presence of aortocoronary bypass graft: Secondary | ICD-10-CM | POA: Diagnosis not present

## 2022-10-13 DIAGNOSIS — Z7984 Long term (current) use of oral hypoglycemic drugs: Secondary | ICD-10-CM | POA: Diagnosis not present

## 2022-10-13 DIAGNOSIS — M86672 Other chronic osteomyelitis, left ankle and foot: Secondary | ICD-10-CM | POA: Diagnosis present

## 2022-10-13 DIAGNOSIS — Z79890 Hormone replacement therapy (postmenopausal): Secondary | ICD-10-CM | POA: Diagnosis not present

## 2022-10-13 DIAGNOSIS — Z8674 Personal history of sudden cardiac arrest: Secondary | ICD-10-CM | POA: Diagnosis not present

## 2022-10-13 DIAGNOSIS — E785 Hyperlipidemia, unspecified: Secondary | ICD-10-CM | POA: Diagnosis present

## 2022-10-13 DIAGNOSIS — J449 Chronic obstructive pulmonary disease, unspecified: Secondary | ICD-10-CM | POA: Diagnosis present

## 2022-10-13 DIAGNOSIS — Z79899 Other long term (current) drug therapy: Secondary | ICD-10-CM | POA: Diagnosis not present

## 2022-10-13 DIAGNOSIS — M86172 Other acute osteomyelitis, left ankle and foot: Secondary | ICD-10-CM | POA: Diagnosis present

## 2022-10-13 DIAGNOSIS — Z791 Long term (current) use of non-steroidal anti-inflammatories (NSAID): Secondary | ICD-10-CM | POA: Diagnosis not present

## 2022-10-13 DIAGNOSIS — I252 Old myocardial infarction: Secondary | ICD-10-CM | POA: Diagnosis not present

## 2022-10-13 DIAGNOSIS — Z8679 Personal history of other diseases of the circulatory system: Secondary | ICD-10-CM | POA: Diagnosis not present

## 2022-10-13 DIAGNOSIS — F17290 Nicotine dependence, other tobacco product, uncomplicated: Secondary | ICD-10-CM | POA: Diagnosis present

## 2022-10-13 DIAGNOSIS — M86272 Subacute osteomyelitis, left ankle and foot: Secondary | ICD-10-CM | POA: Diagnosis not present

## 2022-10-13 DIAGNOSIS — Z7982 Long term (current) use of aspirin: Secondary | ICD-10-CM | POA: Diagnosis not present

## 2022-10-13 HISTORY — DX: Cutaneous abscess of left foot: L02.612

## 2022-10-13 LAB — CBC WITH DIFFERENTIAL/PLATELET
Abs Immature Granulocytes: 0.02 10*3/uL (ref 0.00–0.07)
Basophils Absolute: 0.1 10*3/uL (ref 0.0–0.1)
Basophils Relative: 1 %
Eosinophils Absolute: 0.5 10*3/uL (ref 0.0–0.5)
Eosinophils Relative: 6 %
HCT: 41.6 % (ref 39.0–52.0)
Hemoglobin: 13.4 g/dL (ref 13.0–17.0)
Immature Granulocytes: 0 %
Lymphocytes Relative: 34 %
Lymphs Abs: 3 10*3/uL (ref 0.7–4.0)
MCH: 28.9 pg (ref 26.0–34.0)
MCHC: 32.2 g/dL (ref 30.0–36.0)
MCV: 89.8 fL (ref 80.0–100.0)
Monocytes Absolute: 0.7 10*3/uL (ref 0.1–1.0)
Monocytes Relative: 8 %
Neutro Abs: 4.4 10*3/uL (ref 1.7–7.7)
Neutrophils Relative %: 51 %
Platelets: 346 10*3/uL (ref 150–400)
RBC: 4.63 MIL/uL (ref 4.22–5.81)
RDW: 14 % (ref 11.5–15.5)
WBC: 8.8 10*3/uL (ref 4.0–10.5)
nRBC: 0 % (ref 0.0–0.2)

## 2022-10-13 LAB — COMPREHENSIVE METABOLIC PANEL
ALT: 14 U/L (ref 0–44)
AST: 14 U/L — ABNORMAL LOW (ref 15–41)
Albumin: 3.1 g/dL — ABNORMAL LOW (ref 3.5–5.0)
Alkaline Phosphatase: 73 U/L (ref 38–126)
Anion gap: 10 (ref 5–15)
BUN: 10 mg/dL (ref 6–20)
CO2: 26 mmol/L (ref 22–32)
Calcium: 8.8 mg/dL — ABNORMAL LOW (ref 8.9–10.3)
Chloride: 103 mmol/L (ref 98–111)
Creatinine, Ser: 0.77 mg/dL (ref 0.61–1.24)
GFR, Estimated: >60 mL/min (ref >60)
Glucose, Bld: 92 mg/dL (ref 70–99)
Potassium: 3.5 mmol/L (ref 3.5–5.1)
Sodium: 139 mmol/L (ref 135–145)
Total Bilirubin: 0.4 mg/dL (ref 0.3–1.2)
Total Protein: 6.5 g/dL (ref 6.5–8.1)

## 2022-10-13 LAB — URIC ACID: Uric Acid, Serum: 3.7 mg/dL (ref 3.7–8.6)

## 2022-10-13 LAB — GLUCOSE, CAPILLARY: Glucose-Capillary: 102 mg/dL — ABNORMAL HIGH (ref 70–99)

## 2022-10-13 LAB — CBG MONITORING, ED: Glucose-Capillary: 97 mg/dL (ref 70–99)

## 2022-10-13 LAB — HIV ANTIBODY (ROUTINE TESTING W REFLEX): HIV Screen 4th Generation wRfx: NONREACTIVE

## 2022-10-13 LAB — C-REACTIVE PROTEIN: CRP: 0.5 mg/dL (ref <1.0)

## 2022-10-13 MED ORDER — INSULIN ASPART 100 UNIT/ML IJ SOLN
0.0000 [IU] | Freq: Every day | INTRAMUSCULAR | Status: DC
  Filled 2022-10-13: qty 0.05

## 2022-10-13 MED ORDER — METOPROLOL SUCCINATE ER 25 MG PO TB24: 25.0000 mg | ORAL_TABLET | Freq: Every day | ORAL | Status: DC

## 2022-10-13 MED ORDER — HYDROCODONE-ACETAMINOPHEN 5-325 MG PO TABS
1.0000 | ORAL_TABLET | ORAL | Status: DC | PRN
  Administered 2022-10-13: 1 via ORAL
  Filled 2022-10-13: qty 1

## 2022-10-13 MED ORDER — ONDANSETRON HCL 4 MG/2ML IJ SOLN: 4.0000 mg | Freq: Four times a day (QID) | INTRAMUSCULAR | Status: DC | PRN

## 2022-10-13 MED ORDER — ROSUVASTATIN CALCIUM 20 MG PO TABS: 40.0000 mg | ORAL_TABLET | Freq: Every day | ORAL | Status: DC

## 2022-10-13 MED ORDER — ACETAMINOPHEN 650 MG RE SUPP: 650.0000 mg | Freq: Four times a day (QID) | RECTAL | Status: DC | PRN

## 2022-10-13 MED ORDER — EMPAGLIFLOZIN 25 MG PO TABS
25.0000 mg | ORAL_TABLET | Freq: Every day | ORAL | Status: DC
  Filled 2022-10-13: qty 1

## 2022-10-13 MED ORDER — LACTATED RINGERS IV SOLN: INTRAVENOUS | Status: DC

## 2022-10-13 MED ORDER — INSULIN ASPART 100 UNIT/ML IJ SOLN
0.0000 [IU] | Freq: Three times a day (TID) | INTRAMUSCULAR | Status: DC
  Filled 2022-10-13: qty 0.15

## 2022-10-13 MED ORDER — ACETAMINOPHEN 325 MG PO TABS: 650.0000 mg | ORAL_TABLET | Freq: Four times a day (QID) | ORAL | Status: DC | PRN

## 2022-10-13 MED ORDER — KETOROLAC TROMETHAMINE 15 MG/ML IJ SOLN: 15.0000 mg | Freq: Four times a day (QID) | INTRAMUSCULAR | Status: DC | PRN

## 2022-10-13 MED ORDER — ONDANSETRON HCL 4 MG PO TABS: 4.0000 mg | ORAL_TABLET | Freq: Four times a day (QID) | ORAL | Status: DC | PRN

## 2022-10-13 NOTE — Consult Note (Signed)
[facility] for Infectious Disease    Date of Admission:  10/12/2022     Reason for Consult: Osteomyelitis     Referring Physician: Dr. Bridgett Larsson  Current antibiotics: None   ASSESSMENT:    62 y.o. male admitted with:  Possible osteomyelitis of the left foot vs other inflammatory arthritis: Patient admitted to [facility] last evening from orthopedic surgery.  This was after outpatient MRI showed findings concerning for osteomyelitis in the setting of ongoing foot pain since October 2023. Type 2 diabetes: Hemoglobin A1c pending this morning. History of CAD: Status post CABG.  RECOMMENDATIONS:    Patient is not septic and without any systemic symptoms at this time.  Recommend holding antibiotics until orthopedic surgery can formally weigh in on their opinion of his MRI findings.   I'm not sure if this is osteomyelitis or some other inflammatory condition given that he has not had antibiotics for this condition but reports his foot is "50 times better", there is no signs of infection, and there is no open wound.  Additionally, his ESR is normal If he does have osteomyelitis involving his foot, antimicrobial therapy alone may not be adequate to cure this infection and surgical intervention may need to be undertaken at a later date if it is deferred for now.  However, patient states "I am not having another surgery". He reports having ABI studies done outpatient with no issues so will defer for now and he also has palpable pulses in the left foot. Will follow.   Principal Problem:   Foot osteomyelitis, left ([place]) Active Problems:   Diabetes mellitus type II, controlled ([place])   Hyperlipidemia LDL goal <70   Essential hypertension   S/P CABG x 3   History of ventricular fibrillation   MEDICATIONS:    Scheduled Meds:  empagliflozin  25 mg Oral Daily   insulin aspart  0-15 Units Subcutaneous TID WC   insulin aspart  0-5 Units Subcutaneous QHS   metoprolol succinate  25 mg Oral  Daily   rosuvastatin  40 mg Oral Daily   Continuous Infusions:  lactated ringers 50 mL/hr at 10/13/22 0046   PRN Meds:.acetaminophen **OR** acetaminophen, HYDROcodone-acetaminophen, ketorolac, ondansetron **OR** ondansetron (ZOFRAN) IV  HPI:    Eddie Black is a 62 y.o. male with a past medical history that includes type 2 diabetes, hypertension, CAD status post CABG, hyperlipidemia who was sent to the emergency department last evening due to left foot pain x 6 months with outpatient MRI showing concerns for osteomyelitis.  He was directed to the emergency department by orthopedic surgery for PICC line and IV antibiotics on Friday night.  His initial story dates back to October 2023 when he had onset of left foot pain and swelling.  There was no injury.  There are no open wounds.  He was given a trial of prednisone by his PCP which did not have any benefit.  He was also given a course of TMP-SMX.  Outpatient x-rays were done which did not show anything significant.  He was seen in the emergency department in October.  He had an MRI at that time which demonstrated some heterogeneous bone marrow signal of the tarsal bones and base of the left second through fifth metatarsal.  This was suggestive of arthropathy but no evidence of osteomyelitis.  Patient has been going back and forth between his PCP and podiatry with no resolution.  The foot is painful when walking.  He was placed in a walking boot which  provided some benefit.  He subsequently went and saw orthopedics.  He underwent an MRI scan earlier this week after x-rays showed severe lucencies despite medical management that had developed in the interim.  The report evidently shows osteomyelitis.  He was admitted for PICC line and IV antibiotics.  Per the brief note from orthopedics last night there is no plan for surgical intervention, debridement, cultures.  He has had no systemic symptoms of fevers or chills.  His lab work yesterday was notable  for WBC 10.6, normal lactic acid, normal ESR, CRP pending.  He was given a dose of ceftriaxone and metronidazole in the emergency department, however, further antibiotics were held until further discussion could be had about any sort of surgical intervention to obtain cultures or decrease his burden of infection.   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  Except as noted above in the HPI.  OBJECTIVE:   Blood pressure (!) 138/90, pulse 72, temperature 97.8 F (36.6 C), temperature source Oral, resp. rate 16, height 5\' 8"  (1.727 m), weight 88 kg, SpO2 98 %. Body mass index is 29.5 kg/m.  Physical Exam Constitutional:      Appearance: Normal appearance.  HENT:     Head: Normocephalic and atraumatic.  Eyes:     Extraocular Movements: Extraocular movements intact.     Conjunctiva/sclera: Conjunctivae normal.  Cardiovascular:     Rate and Rhythm: Normal rate and regular rhythm.     Pulses: Normal pulses.  Pulmonary:     Effort: Pulmonary effort is normal. No respiratory distress.     Breath sounds: Normal breath sounds.  Abdominal:     General: There is no distension.     Palpations: Abdomen is soft.  Musculoskeletal:        General: Tenderness present. No swelling or signs of injury.     Cervical back: Normal range of motion  and neck supple.     Comments: Left foot with some TTP over 4th and 5th metatarsal area.  No warmth, erythema, drainage, or other signs of infection.   Skin:    General: Skin is warm and dry.  Neurological:     General: No focal deficit present.     Mental Status: He is alert and oriented to person, place, and time.  Psychiatric:        Mood and Affect: Mood normal.        Behavior: Behavior normal.      Lab Results: Lab Results  Component Value Date   WBC 8.8 10/13/2022   HGB 13.4 10/13/2022   HCT 41.6 10/13/2022   MCV 89.8 10/13/2022   PLT 346 10/13/2022    Lab Results  Component Value Date  NA 139 10/13/2022   K 3.5 10/13/2022   CO2 26 10/13/2022   GLUCOSE 92 10/13/2022   BUN 10 10/13/2022   CREATININE 0.77 10/13/2022   CALCIUM 8.8 (L) 10/13/2022   GFRNONAA >60 10/13/2022   GFRAA 111 11/14/2017    Lab Results  Component Value Date   ALT 14 10/13/2022   AST 14 (L) 10/13/2022   ALKPHOS 73 10/13/2022   BILITOT 0.4 10/13/2022       Component Value Date/Time   CRP 9.7 (H) 05/16/2022 1942       Component Value Date/Time   ESRSEDRATE 7 10/12/2022 2007    I have reviewed the micro and lab results in Epic.  Imaging: No results found.   Imaging independently reviewed in Epic.  [facility] [phone] pager 10/13/2022, 7:11 AM   I have personally spent 80 minutes involved in face-to-face and non-face-to-face activities for this patient on the day of the visit. Professional time spent includes the following activities: Preparing to see the patient (review of tests), Obtaining and/or reviewing separately obtained history (admission/discharge record), Performing a medically appropriate examination and/or evaluation , Ordering medications/tests/procedures, referring and communicating with other health care professionals, Documenting clinical information in the EMR, Independently interpreting  results (not separately reported), Communicating results to the patient/family/caregiver, Counseling and educating the patient/family/caregiver and Care coordination (not separately reported).

## 2022-10-13 NOTE — ED Notes (Signed)
ED TO INPATIENT HANDOFF REPORT  ED Nurse Name and Phone #: Waynette Buttery Name/Age/Gender Eddie Black 62 y.o. male Room/Bed: WA24/WA24  Code Status   Code Status: Full Code  Home/SNF/Other Home Patient oriented to: self, place, time, and situation Is this baseline? Yes   Triage Complete: Triage complete  Chief Complaint Foot osteomyelitis, left [facility]) [M86.9]  Triage Note Patient arrives ambulatory by POV with wife. Patient had MRI of left foot yesterday and was told he has osteomyelitis. Patient has been having issues with left foot since October.    Allergies No Known Allergies  Level of Care/Admitting Diagnosis ED Disposition     ED Disposition  Admit   Condition  --   Comment  Hospital Area: [place] [[id number]]  Level of Care: Med-Surg [16]  May place patient in observation at [facility] or [place] if equivalent level of care is available:: No  Covid Evaluation: Asymptomatic - no recent exposure (last 10 days) testing not required  Diagnosis: Foot osteomyelitis, left [id number]  Admitting Physician: Bridgett Larsson, [place]  Attending Physician: Bridgett Larsson, ERIC [[id number]]          B Medical/Surgery History Past Medical History:  Diagnosis Date   Acute hypoxemic respiratory failure ([place])    Cardiac arrest ([place]) 10/25/2017   COPD exacerbation ([place])    Diabetes mellitus ([place])    Hyperlipemia    Hypertension    MI, acute, non ST segment elevation ([place])    S/P hip replacement, right 07/18/2021   Tobacco abuse    Ventricular fibrillation ([place]) 10/25/2017   Past Surgical History:  Procedure Laterality Date   CORONARY ARTERY BYPASS GRAFT N/A 10/25/2017   Procedure: CORONARY ARTERY BYPASS GRAFTING (CABG) times three, WITH ENDOSCOPIC HARVESTING OF RIGHT SAPHENOUS VEIN;  Surgeon: Ivin Poot, MD;  Location: [place];  Service: Open Heart Surgery;  Laterality: N/A;   IABP INSERTION N/A 10/25/2017   Procedure: IABP Insertion;  Surgeon:  Belva Crome, MD;  Location: [place] [facility];  Service: Cardiovascular;  Laterality: N/A;   LEFT HEART CATH AND CORONARY ANGIOGRAPHY N/A 10/25/2017   Procedure: LEFT HEART CATH AND CORONARY ANGIOGRAPHY;  Surgeon: Belva Crome, MD;  Location: [place] [facility];  Service: Cardiovascular;  Laterality: N/A;   TEE WITHOUT CARDIOVERSION N/A 10/25/2017   Procedure: TRANSESOPHAGEAL ECHOCARDIOGRAM (TEE);  Surgeon: Prescott Gum, Collier Salina, MD;  Location: [place];  Service: Open Heart Surgery;  Laterality: N/A;     A IV Location/Drains/Wounds Patient Lines/Drains/Airways Status     Active Line/Drains/Airways     Name Placement date Placement time Site Days   Peripheral IV 10/12/22 20 G Right Antecubital 10/12/22  1951  Antecubital  1   Incision (Closed) 10/25/17 Chest Other (Comment) 10/25/17  2030  -- 1814   Incision (Closed) 10/25/17 Leg Right 10/25/17  2030  -- 1814            Intake/Output Last 24 hours  Intake/Output Summary (Last 24 hours) at 10/13/2022 0718 Last data filed at 10/12/2022 2158 Gross per 24 hour  Intake 200 ml  Output --  Net 200 ml    Labs/Imaging Results for orders placed or performed during the hospital encounter of 10/12/22 (from the past 48 hour(s))  Comprehensive metabolic panel     Status: Abnormal   Collection Time: 10/12/22  8:07 PM  Result Value Ref Range   Sodium 137 135 - 145 mmol/L   Potassium 3.3 (L) 3.5 - 5.1 mmol/L  Chloride 107 98 - 111 mmol/L   CO2 24 22 - 32 mmol/L   Glucose, Bld 84 70 - 99 mg/dL    Comment: Glucose reference range applies only to samples taken after fasting for at least 8 hours.   BUN 8 6 - 20 mg/dL   Creatinine, Ser 0.73 0.61 - 1.24 mg/dL   Calcium 8.9 8.9 - 10.3 mg/dL   Total Protein 7.5 6.5 - 8.1 g/dL   Albumin 3.9 3.5 - 5.0 g/dL   AST 17 15 - 41 U/L   ALT 16 0 - 44 U/L   Alkaline Phosphatase 86 38 - 126 U/L   Total Bilirubin 0.7 0.3 - 1.2 mg/dL   GFR, Estimated >60 >60 mL/min    Comment: (NOTE) Calculated using the  CKD-EPI Creatinine Equation (2021)    Anion gap 6 5 - 15    Comment: Performed at [facility], [place] [address] [place], [place] [address]  CBC with Differential     Status: Abnormal   Collection Time: 10/12/22  8:07 PM  Result Value Ref Range   WBC 10.6 (H) 4.0 - 10.5 K/uL   RBC 5.01 4.22 - 5.81 MIL/uL   Hemoglobin 14.3 13.0 - 17.0 g/dL   HCT 44.8 39.0 - 52.0 %   MCV 89.4 80.0 - 100.0 fL   MCH 28.5 26.0 - 34.0 pg   MCHC 31.9 30.0 - 36.0 g/dL   RDW 13.9 11.5 - 15.5 %   Platelets 380 150 - 400 K/uL   nRBC 0.0 0.0 - 0.2 %   Neutrophils Relative % 59 %   Neutro Abs 6.4 1.7 - 7.7 K/uL   Lymphocytes Relative 30 %   Lymphs Abs 3.1 0.7 - 4.0 K/uL   Monocytes Relative 7 %   Monocytes Absolute 0.7 0.1 - 1.0 K/uL   Eosinophils Relative 3 %   Eosinophils Absolute 0.3 0.0 - 0.5 K/uL   Basophils Relative 1 %   Basophils Absolute 0.1 0.0 - 0.1 K/uL   Immature Granulocytes 0 %   Abs Immature Granulocytes 0.02 0.00 - 0.07 K/uL    Comment: Performed at [facility], [place] [address], [place], [place] [address]  Lactic acid     Status: None   Collection Time: 10/12/22  8:07 PM  Result Value Ref Range   Lactic Acid, Venous 0.8 0.5 - 1.9 mmol/L    Comment: Performed at [facility], [place] [address], [place], [place] [address]  Sedimentation rate     Status: None   Collection Time: 10/12/22  8:07 PM  Result Value Ref Range   Sed Rate 7 0 - 16 mm/hr    Comment: Performed at [facility], [place] [address], [place], [place] [address]  Uric acid     Status: None   Collection Time: 10/12/22  8:07 PM  Result Value Ref Range   Uric Acid, Serum 3.7 3.7 - 8.6 mg/dL    Comment: Performed at [facility], [place] [address] [place], [place] [address]  CBG monitoring, ED     Status: None   Collection Time: 10/13/22 12:41 AM  Result Value Ref Range   Glucose-Capillary 97 70 - 99 mg/dL    Comment: Glucose reference  range applies only to samples taken after fasting for at least 8 hours.  Comprehensive metabolic panel     Status: Abnormal   Collection Time: 10/13/22  4:56 AM  Result Value Ref Range   Sodium 139  135 - 145 mmol/L   Potassium 3.5 3.5 - 5.1 mmol/L   Chloride 103 98 - 111 mmol/L   CO2 26 22 - 32 mmol/L   Glucose, Bld 92 70 - 99 mg/dL    Comment: Glucose reference range applies only to samples taken after fasting for at least 8 hours.   BUN 10 6 - 20 mg/dL   Creatinine, Ser 0.77 0.61 - 1.24 mg/dL   Calcium 8.8 (L) 8.9 - 10.3 mg/dL   Total Protein 6.5 6.5 - 8.1 g/dL   Albumin 3.1 (L) 3.5 - 5.0 g/dL   AST 14 (L) 15 - 41 U/L   ALT 14 0 - 44 U/L   Alkaline Phosphatase 73 38 - 126 U/L   Total Bilirubin 0.4 0.3 - 1.2 mg/dL   GFR, Estimated >60 >60 mL/min    Comment: (NOTE) Calculated using the CKD-EPI Creatinine Equation (2021)    Anion gap 10 5 - 15    Comment: Performed at [facility], [place] [address], [place], [place] [address]  CBC with Differential/Platelet     Status: None   Collection Time: 10/13/22  4:56 AM  Result Value Ref Range   WBC 8.8 4.0 - 10.5 K/uL   RBC 4.63 4.22 - 5.81 MIL/uL   Hemoglobin 13.4 13.0 - 17.0 g/dL   HCT 41.6 39.0 - 52.0 %   MCV 89.8 80.0 - 100.0 fL   MCH 28.9 26.0 - 34.0 pg   MCHC 32.2 30.0 - 36.0 g/dL   RDW 14.0 11.5 - 15.5 %   Platelets 346 150 - 400 K/uL   nRBC 0.0 0.0 - 0.2 %   Neutrophils Relative % 51 %   Neutro Abs 4.4 1.7 - 7.7 K/uL   Lymphocytes Relative 34 %   Lymphs Abs 3.0 0.7 - 4.0 K/uL   Monocytes Relative 8 %   Monocytes Absolute 0.7 0.1 - 1.0 K/uL   Eosinophils Relative 6 %   Eosinophils Absolute 0.5 0.0 - 0.5 K/uL   Basophils Relative 1 %   Basophils Absolute 0.1 0.0 - 0.1 K/uL   Immature Granulocytes 0 %   Abs Immature Granulocytes 0.02 0.00 - 0.07 K/uL    Comment: Performed at [facility], [place] [address] [place], [place] [address]   No results found.  Pending Labs Unresulted Labs  (From admission, onward)     Start     Ordered   10/13/22 0006  Hemoglobin A1c  Add-on,   AD       Comments: To assess prior glycemic control    10/13/22 0005   10/13/22 0006  HIV Antibody (routine testing w rflx)  (HIV Antibody (Routine testing w reflex) panel)  Add-on,   AD        10/13/22 0005   10/12/22 2233  C-reactive protein  Add-on,   AD        10/12/22 2232   10/12/22 1918  Blood Cultures x 2 sites  BLOOD CULTURE X 2,   STAT      10/12/22 1917            Vitals/Pain Today's Vitals   10/12/22 2316 10/13/22 0053 10/13/22 0247 10/13/22 0640  BP:   131/88 (!) 138/90  Pulse:   64 72  Resp:   16 16  Temp: 98.4 F (36.9 C)  97.9 F (36.6 C) 97.8 F (36.6 C)  TempSrc: Oral  Oral Oral  SpO2:   95% 98%  Weight:  Height:      PainSc:  4  0-No pain     Isolation Precautions No active isolations  Medications Medications  metoprolol succinate (TOPROL-XL) 24 hr tablet 25 mg (has no administration in time range)  rosuvastatin (CRESTOR) tablet 40 mg (has no administration in time range)  empagliflozin (JARDIANCE) tablet 25 mg (has no administration in time range)  insulin aspart (novoLOG) injection 0-15 Units (has no administration in time range)  insulin aspart (novoLOG) injection 0-5 Units ( Subcutaneous Not Given 10/13/22 0043)  ketorolac (TORADOL) 15 MG/ML injection 15 mg (has no administration in time range)  acetaminophen (TYLENOL) tablet 650 mg (has no administration in time range)    Or  acetaminophen (TYLENOL) suppository 650 mg (has no administration in time range)  HYDROcodone-acetaminophen (NORCO/VICODIN) 5-325 MG per tablet 1-2 tablet (1 tablet Oral Given 10/13/22 0052)  ondansetron (ZOFRAN) tablet 4 mg (has no administration in time range)    Or  ondansetron (ZOFRAN) injection 4 mg (has no administration in time range)  lactated ringers infusion ( Intravenous New Bag/Given 10/13/22 0046)  potassium chloride SA (KLOR-CON M) CR tablet 40 mEq (40 mEq Oral  Given 10/12/22 2316)    Mobility walks     Focused Assessments Cardiac Assessment Handoff:    Lab Results  Component Value Date   TROPONINI 0.10 ([place]) 10/25/2017   No results found for: "DDIMER" Does the Patient currently have chest pain? No    R Recommendations: See Admitting Provider Note  Report given to:   Additional Notes:

## 2022-10-13 NOTE — Discharge Summary (Incomplete)
Physician Discharge Summary  Eddie Black [id number] DOB: Apr 06, 1961 DOA: 10/12/2022  PCP: Marco Collie, MD  Admit date: 10/12/2022 Discharge date: 10/13/2022 Discharging to: *** Recommendations for Outpatient Follow-up:  ***  Consults:  *** Procedures:  ***   Discharge Diagnoses:   Principal Problem:   Foot osteomyelitis, left ([place]) Active Problems:   Diabetes mellitus type II, controlled ([place])   Hyperlipidemia LDL goal <70   Essential hypertension   S/P CABG x 3   History of ventricular fibrillation   Foot abscess, left     Hospital Course:  ***  Principal Problem:   Foot osteomyelitis, left ([place]) Active Problems:   Diabetes mellitus type II, controlled ([place])   Hyperlipidemia LDL goal <70   Essential hypertension   S/P CABG x 3   History of ventricular fibrillation   Foot abscess, left    Body mass index is 29.5 kg/m. Nutrition Status:          Discharge Instructions   Allergies as of 10/13/2022   No Known Allergies   Med Rec must be completed prior to using this [facility]***           The results of significant diagnostics from this hospitalization (including imaging, microbiology, ancillary and laboratory) are listed below for reference.    No results found. Labs:   Basic Metabolic Panel: Recent Labs  Lab 10/12/22 2007 10/13/22 0456  NA 137 139  K 3.3* 3.5  CL 107 103  CO2 24 26  GLUCOSE 84 92  BUN 8 10  CREATININE 0.73 0.77  CALCIUM 8.9 8.8*     CBC: Recent Labs  Lab 10/12/22 2007 10/13/22 0456  WBC 10.6* 8.8  NEUTROABS 6.4 4.4  HGB 14.3 13.4  HCT 44.8 41.6  MCV 89.4 89.8  PLT 380 346         SIGNED:   Debbe Odea, MD  [facility] 10/13/2022, 9:34 AM

## 2022-10-13 NOTE — Plan of Care (Signed)
  Problem: Education: Goal: Ability to describe self-care measures that may prevent or decrease complications (Diabetes Survival Skills Education) will improve Outcome: Progressing   Problem: Coping: Goal: Ability to adjust to condition or change in health will improve Outcome: Progressing   Problem: Fluid Volume: Goal: Ability to maintain a balanced intake and output will improve Outcome: Progressing   Problem: Metabolic: Goal: Ability to maintain appropriate glucose levels will improve Outcome: Progressing   Problem: Nutritional: Goal: Maintenance of adequate nutrition will improve Outcome: Progressing   Problem: Skin Integrity: Goal: Risk for impaired skin integrity will decrease Outcome: Progressing   Problem: Tissue Perfusion: Goal: Adequacy of tissue perfusion will improve Outcome: Progressing   

## 2022-10-13 NOTE — Progress Notes (Signed)
Provided discharge education/instructions, all questions answered. Pt not in any distress, discharged home with belongings accompanied by his wife.

## 2022-10-15 LAB — HEMOGLOBIN A1C
Hgb A1c MFr Bld: 6.8 % — ABNORMAL HIGH (ref 4.8–5.6)
Mean Plasma Glucose: 148 mg/dL

## 2022-10-17 LAB — CULTURE, BLOOD (ROUTINE X 2)
Culture: NO GROWTH
Culture: NO GROWTH
Special Requests: ADEQUATE

## 2022-10-19 DIAGNOSIS — M14672 Charcot's joint, left ankle and foot: Secondary | ICD-10-CM | POA: Diagnosis not present

## 2022-10-23 ENCOUNTER — Encounter: Payer: Self-pay | Admitting: Infectious Diseases

## 2022-10-23 ENCOUNTER — Ambulatory Visit: Payer: BC Managed Care – PPO | Attending: Infectious Diseases | Admitting: Infectious Diseases

## 2022-10-23 VITALS — BP 125/84 | HR 98 | Temp 97.8°F | Wt 195.0 lb

## 2022-10-23 DIAGNOSIS — M79672 Pain in left foot: Secondary | ICD-10-CM | POA: Insufficient documentation

## 2022-10-23 DIAGNOSIS — J449 Chronic obstructive pulmonary disease, unspecified: Secondary | ICD-10-CM | POA: Insufficient documentation

## 2022-10-23 DIAGNOSIS — Z794 Long term (current) use of insulin: Secondary | ICD-10-CM | POA: Diagnosis not present

## 2022-10-23 DIAGNOSIS — I2510 Atherosclerotic heart disease of native coronary artery without angina pectoris: Secondary | ICD-10-CM | POA: Diagnosis not present

## 2022-10-23 DIAGNOSIS — F17200 Nicotine dependence, unspecified, uncomplicated: Secondary | ICD-10-CM | POA: Diagnosis not present

## 2022-10-23 DIAGNOSIS — I10 Essential (primary) hypertension: Secondary | ICD-10-CM | POA: Diagnosis not present

## 2022-10-23 DIAGNOSIS — Z7984 Long term (current) use of oral hypoglycemic drugs: Secondary | ICD-10-CM | POA: Diagnosis not present

## 2022-10-23 DIAGNOSIS — E1169 Type 2 diabetes mellitus with other specified complication: Secondary | ICD-10-CM

## 2022-10-23 DIAGNOSIS — M14679 Charcot's joint, unspecified ankle and foot: Secondary | ICD-10-CM

## 2022-10-23 DIAGNOSIS — Z951 Presence of aortocoronary bypass graft: Secondary | ICD-10-CM | POA: Insufficient documentation

## 2022-10-23 DIAGNOSIS — Z79899 Other long term (current) drug therapy: Secondary | ICD-10-CM | POA: Diagnosis not present

## 2022-10-23 DIAGNOSIS — E11610 Type 2 diabetes mellitus with diabetic neuropathic arthropathy: Secondary | ICD-10-CM | POA: Insufficient documentation

## 2022-10-23 DIAGNOSIS — E1136 Type 2 diabetes mellitus with diabetic cataract: Secondary | ICD-10-CM | POA: Diagnosis not present

## 2022-10-23 NOTE — Patient Instructions (Signed)
You are here for left foot pain since oct 2023- you have had 2 MRI- I am not able to see the last one taken at your ortho office March 2024- there is a question of charcot VS osteomyelitis- You have normal ESR/CRP/cbc in March 2024- you can repeat those labs in 1 month- if the pain worsens, or appearance of erythema or fever or any change to the foot from baseline  bone biopsy , culture and pathology will need to be done . Follow PRN

## 2022-10-23 NOTE — Progress Notes (Signed)
NAME: Eddie Black  DOB: 1960-10-11  MRN: [id number]  Date/Time: 10/23/2022 8:14 PM   Subjective:   ? Eddie Black is a 62 y.o. male with a history of DM, CAD, S/P CABG, HTN, COPD, Rt THA, Rt TKA, current smoker Presents with left foot pain since oct 2023 No preceding trauma HE works in Marketing executive , on his feet Also until 2 years ago he was having lot of pain rt knee and rt hip and was favoring the left lower extremity a lot MRI done on 05/16/22 showed Heterogeneous marrow signal of the tarsal bones and bases of the second through fifth metatarsals suggesting arthropathy likely secondary to diabetic neuropathy. No definite MR evidence of acute osteomyelitis. HE went to [facility] ED    Was discharged on pain meds He followed up with PCP and podiatrist with progression of pain He was placed on a walking boot In March 2024 he saw [facility] orthopedician and had xray foot which showed charcot changes He had MRI on 3/14 and was sent to the ED because it was read as osteo and was asked to get IV antibiotics Dr.Wallace ID was consulted and he rightly recommended withholding antibiotics as he was not sure this was osteo Pt had normal ESR/CRP/no systemic signs. 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   Tobacco abuse    Ventricular fibrillation ([place]) 10/25/2017    Past Surgical History:  Procedure Laterality Date   CORONARY ARTERY BYPASS GRAFT N/A  10/25/2017   Procedure: CORONARY ARTERY BYPASS GRAFTING (CABG) times three, WITH ENDOSCOPIC HARVESTING OF RIGHT SAPHENOUS VEIN;  Surgeon: Ivin Poot, MD;  Location: [place];  Service: Open Heart Surgery;  Laterality: N/A;   IABP INSERTION N/A 10/25/2017   Procedure: IABP Insertion;  Surgeon: Belva Crome, MD;  Location: [place] [facility];  Service: Cardiovascular;  Laterality: N/A;   LEFT HEART CATH AND CORONARY ANGIOGRAPHY N/A 10/25/2017   Procedure: LEFT HEART CATH AND CORONARY ANGIOGRAPHY;  Surgeon: Belva Crome, MD;  Location: [place] [facility];  Service: Cardiovascular;  Laterality: N/A;   TEE WITHOUT CARDIOVERSION N/A 10/25/2017   Procedure: TRANSESOPHAGEAL ECHOCARDIOGRAM (TEE);  Surgeon: Prescott Gum, Collier Salina, MD;  Location: [place];  Service: Open Heart Surgery;  Laterality: N/A;    Social History   Socioeconomic History   Marital status: Married    Spouse name: Not on file   Number of children: Not on file   Years of education: Not on file   Highest education level: Not on file  Occupational History   Not on file  Tobacco Use   Smoking status: Former   Smokeless tobacco: Never  Vaping Use   Vaping Use: Every day  Substance and Sexual Activity   Alcohol use: Yes   Drug use: Yes    Types: Marijuana   Sexual activity: Not on  file  Other Topics Concern   Not on file  Social History Narrative   Not on file   Social Determinants of Health   Financial Resource Strain: Not on file  Food Insecurity: No Food Insecurity (10/13/2022)   Hunger Vital Sign    Worried About Running Out of Food in the Last Year: Never true    Ran Out of Food in the Last Year: Never true  Transportation Needs: No Transportation Needs (10/13/2022)   PRAPARE - Hydrologist (Medical): No    Lack of Transportation (Non-Medical): No  Physical Activity: Not on file  Stress: Not on file  Social Connections: Not on file  Intimate Partner Violence: Not At Risk (10/13/2022)    Humiliation, Afraid, Rape, and Kick questionnaire    Fear of Current or Ex-Partner: No    Emotionally Abused: No    Physically Abused: No    Sexually Abused: No    Family History  Family history unknown: Yes   No Known Allergies I? Current Outpatient Medications  Medication Sig Dispense Refill   atorvastatin (LIPITOR) 80 MG tablet Take 80 mg by mouth at bedtime.     enalapril (VASOTEC) 10 MG tablet Take 1 tablet (10 mg total) by mouth daily. 90 tablet 3   ezetimibe (ZETIA) 10 MG tablet Take 10 mg by mouth daily.     JARDIANCE 25 MG TABS tablet Take 25 mg by mouth daily.     levothyroxine (SYNTHROID, LEVOTHROID) 25 MCG tablet Take 25 mcg by mouth daily.     metoprolol succinate (TOPROL-XL) 25 MG 24 hr tablet Take 25 mg by mouth daily.     Omega-3 Fatty Acids (FISH OIL TRIPLE STRENGTH PO) Take 1 capsule by mouth 2 (two) times daily.     rosuvastatin (CRESTOR) 40 MG tablet Take 1 tablet (40 mg total) by mouth daily. 90 tablet 3   TRULICITY 3 0000000 SOPN Inject 3 mg into the skin once a week.     diclofenac (VOLTAREN) 75 MG EC tablet Take 75 mg by mouth 2 (two) times daily. (Patient not taking: Reported on 10/23/2022)     hydrOXYzine (ATARAX) 25 MG tablet Take 25 mg by mouth every 6 (six) hours as needed. (Patient not taking: Reported on 10/23/2022)     oxyCODONE-acetaminophen (PERCOCET/ROXICET) 5-325 MG tablet Take 1 tablet by mouth every 6 (six) hours as needed for severe pain. (Patient not taking: Reported on 10/13/2022) 10 tablet 0   No current facility-administered medications for this visit.     Abtx:  Anti-infectives (From admission, onward)    None       REVIEW OF SYSTEMS:  Const: negative fever, negative chills, negative weight loss Eyes: negative diplopia or visual changes, negative eye pain ENT: negative coryza, negative sore throat Resp:baseline cough Cards: negative for chest pain, palpitations, lower extremity edema GU: negative for frequency, dysuria and  hematuria GI: Negative for abdominal pain, diarrhea, bleeding, constipation Skin: negative for rash and pruritus Heme: negative for easy bruising and gum/nose bleeding [id number] left foot pain is better than before especially with special boot Neurolo:negative for headaches, dizziness, vertigo, memory problems  Psych: negative for feelings of anxiety, depression  Endocrine:  diabetes Allergy/Immunology- negative for any medication or food allergies ? P Objective:  VITALS:  BP 125/84   Pulse 98   Temp 97.8 F (36.6 C) (Oral)   Wt 195 lb (88.5 kg)   SpO2 98%   BMI 29.65 kg/m   PHYSICAL EXAM:  General: Alert, cooperative, no distress, appears stated age.  Head: Normocephalic, without obvious abnormality, atraumatic. Eyes: Conjunctivae clear, anicteric sclerae. Pupils are equal ENT Nares normal. No drainage or sinus tenderness. Lips, mucosa, and tongue normal. No Thrush Neck: Supple, symmetrical, no adenopathy, thyroid: non tender no carotid bruit and no JVD. Lungs:b.l air entry Heart: Regular rate and rhythm, no murmur, rub or gallop. Abdomen: Soft, non-tender,not distended. Bowel sounds normal. No masses Extremities: left foot swollen uniformy Arch is being flattended           No specific are of erythema or warmth Skin: No rashes or lesions. Or bruising Lymph: Cervical, supraclavicular normal. Neurologic: Grossly non-focal Pertinent Labs Lab Results CBC    Component Value Date/Time   WBC 8.8 10/13/2022 0456   RBC 4.63 10/13/2022 0456   HGB 13.4 10/13/2022 0456   HGB 15.4 10/27/2020 0919   HCT 41.6 10/13/2022 0456   HCT 46.1 10/27/2020 0919   PLT 346 10/13/2022 0456   PLT 315 10/27/2020 0919   MCV 89.8 10/13/2022 0456   MCV 91 10/27/2020 0919   MCH 28.9 10/13/2022 0456   MCHC 32.2 10/13/2022 0456   RDW 14.0 10/13/2022 0456   RDW 13.0 10/27/2020 0919   LYMPHSABS 3.0 10/13/2022 0456   LYMPHSABS 1.8 10/27/2020 0919   MONOABS 0.7 10/13/2022 0456   EOSABS  0.5 10/13/2022 0456   EOSABS 0.7 (H) 10/27/2020 0919   BASOSABS 0.1 10/13/2022 0456   BASOSABS 0.1 10/27/2020 0919       Latest Ref Rng & Units 10/13/2022    4:56 AM 10/12/2022    8:07 PM 05/16/2022    7:42 PM  CMP  Glucose 70 - 99 mg/dL 92  84  275   BUN 6 - 20 mg/dL 10  8  17    Creatinine 0.61 - 1.24 mg/dL 0.77  0.73  1.10   Sodium 135 - 145 mmol/L 139  137  135   Potassium 3.5 - 5.1 mmol/L 3.5  3.3  4.1   Chloride 98 - 111 mmol/L 103  107  100   CO2 22 - 32 mmol/L 26  24  24    Calcium 8.9 - 10.3 mg/dL 8.8  8.9  8.8   Total Protein 6.5 - 8.1 g/dL 6.5  7.5  7.6   Total Bilirubin 0.3 - 1.2 mg/dL 0.4  0.7  0.5   Alkaline Phos 38 - 126 U/L 73  86  69   AST 15 - 41 U/L 14  17  21    ALT 0 - 44 U/L 14  16  33     IMAGING RESULTS: Unable to see the latest MRI I have personally reviewed the films ? Impression/Recommendation ?62 y.o. 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 Repeat MRI in March 2024- I am not able to see those flims as it was done in [facility] So unable to comment on the status of his foot radiologically But he has no fever, no leucocytosis, normal ESR/CRP 10/13/22 when he was in [place] long He has no open wound , ulcer or such to explain underlying bone osteomyelitis  Charcot arthropathy very much mimics osteomyelitis  But osteomyelitis is usually contiguous or bacteremic For the former there is no open wound For the latter he does not have any systemic or localized signs of infection In the future there is  risk for spontaneous fractures and secondary infection The only way to say for sure what the pathology is  to do bone biopsy and send for histopathological examination,  culture ( bacterial, Afb, fungal) and save some for molecular next generation sequencing There are risks involved with this procedure as it may cause an  open wound which may not heal. Wonder whether IR can do a less invasive biopsy Can also track ESR/CRP and clinically if any changes then can do biopsy Discussed with him that smoking can make diabetic arteriopathy worse  DM- trulicity jardiance  CAD s/p CABG- on statin  COPD  Explained in great detail with  patient and his wife. Will try to get in touch with his ortho       ? ? ___________________________________________________ Discussed with patient, requesting provider Note:  This document was prepared using Dragon voice recognition software and may include unintentional dictation errors.

## 2022-10-25 NOTE — Discharge Summary (Signed)
Physician Discharge Summary  Eddie Black [id number] DOB: 1960/09/25 DOA: 10/12/2022  PCP: Marco Collie, MD  Admit date: 10/12/2022 Discharge date: 10/25/2022 Discharging to: home Recommendations for Outpatient Follow-up:  Will need f/u with ID after bpsy  Consults:  ID, Ortho     Discharge Diagnoses:   Principal Problem:   Foot osteomyelitis, left ([place]) Active Problems:   Diabetes mellitus type II, controlled ([place])   Hyperlipidemia LDL goal <70   Essential hypertension   S/P CABG x 3   History of ventricular fibrillation   Hospital Course:  62 year old Caucasian male history of type 2 diabetes, hypertension, history of coronary status post CABG, hyperlipidemia who has been dealing with left foot pain for the last 6 months.  He has been seen multiple times by primary care, podiatry, by ortho and in the ED.  He underwent MRI scanning 2 days ago.  He just got the report today.  Reportedly he had evidence of osteomyelitis.   Patient has never had any bone biopsies or cultures of this area performed.  He was sent to the ER by orthopedics to get a PICC line and IV antibiotics.   Tried hospitalist contacted for admission.  Principal Problem:   Possible left Foot osteomyelitis  - patient will need a biopsy prior to starting antibiotics- His biopsy cannot be done until Monday ( in 2 days) and he insists on being discharged. I agree with him that he can have the procedure done as outpt.  - appreciate ID and ortho eval          Discharge Instructions  Discharge Instructions     Diet - low sodium heart healthy   Complete by: As directed    Diet Carb Modified   Complete by: As directed    Increase activity slowly   Complete by: As directed       Allergies as of 10/13/2022   No Known Allergies      Medication List     STOP taking these medications    aspirin EC 81 MG tablet       TAKE these medications    atorvastatin 80 MG tablet Commonly known as:  LIPITOR Take 80 mg by mouth at bedtime.   diclofenac 75 MG EC tablet Commonly known as: VOLTAREN Take 75 mg by mouth 2 (two) times daily.   enalapril 10 MG tablet Commonly known as: Vasotec Take 1 tablet (10 mg total) by mouth daily.   ezetimibe 10 MG tablet Commonly known as: ZETIA Take 10 mg by mouth daily.   FISH OIL TRIPLE STRENGTH PO Take 1 capsule by mouth 2 (two) times daily.   hydrOXYzine 25 MG tablet Commonly known as: ATARAX Take 25 mg by mouth every 6 (six) hours as needed.   Jardiance 25 MG Tabs tablet Generic drug: empagliflozin Take 25 mg by mouth daily.   levothyroxine 25 MCG tablet Commonly known as: SYNTHROID Take 25 mcg by mouth daily.   metoprolol succinate 25 MG 24 hr tablet Commonly known as: TOPROL-XL Take 25 mg by mouth daily.   oxyCODONE-acetaminophen 5-325 MG tablet Commonly known as: PERCOCET/ROXICET Take 1 tablet by mouth every 6 (six) hours as needed for severe pain.   rosuvastatin 40 MG tablet Commonly known as: CRESTOR Take 1 tablet (40 mg total) by mouth daily.   Trulicity 3 0000000 Sopn Generic drug: Dulaglutide Inject 3 mg into the skin once a week.            The results of significant diagnostics  from this hospitalization (including imaging, microbiology, ancillary and laboratory) are listed below for reference.    No results found. Labs:   Basic Metabolic Panel: No results for input(s): "NA", "K", "CL", "CO2", "GLUCOSE", "BUN", "CREATININE", "CALCIUM", "MG", "PHOS" in the last 168 hours.   CBC: No results for input(s): "WBC", "NEUTROABS", "HGB", "HCT", "MCV", "PLT" in the last 168 hours.       SIGNED:   Debbe Odea, MD  [facility] 10/25/2022, 9:37 AM

## 2022-11-08 DIAGNOSIS — M14672 Charcot's joint, left ankle and foot: Secondary | ICD-10-CM | POA: Diagnosis not present

## 2022-12-20 DIAGNOSIS — M14672 Charcot's joint, left ankle and foot: Secondary | ICD-10-CM | POA: Diagnosis not present

## 2023-01-07 DIAGNOSIS — I152 Hypertension secondary to endocrine disorders: Secondary | ICD-10-CM | POA: Diagnosis not present

## 2023-01-07 DIAGNOSIS — E785 Hyperlipidemia, unspecified: Secondary | ICD-10-CM | POA: Diagnosis not present

## 2023-01-07 DIAGNOSIS — E1169 Type 2 diabetes mellitus with other specified complication: Secondary | ICD-10-CM | POA: Diagnosis not present

## 2023-01-07 DIAGNOSIS — Z6829 Body mass index (BMI) 29.0-29.9, adult: Secondary | ICD-10-CM | POA: Diagnosis not present

## 2023-01-07 DIAGNOSIS — F17200 Nicotine dependence, unspecified, uncomplicated: Secondary | ICD-10-CM | POA: Diagnosis not present

## 2023-01-07 DIAGNOSIS — E039 Hypothyroidism, unspecified: Secondary | ICD-10-CM | POA: Diagnosis not present

## 2023-01-07 DIAGNOSIS — E1159 Type 2 diabetes mellitus with other circulatory complications: Secondary | ICD-10-CM | POA: Diagnosis not present

## 2023-01-08 ENCOUNTER — Telehealth: Payer: Self-pay | Admitting: Cardiology

## 2023-01-08 NOTE — Telephone Encounter (Signed)
I called and spoke w/ PCP office and [facility]. PCP's office reports they have been following lipids -- recent LDL is around 40 (they are faxing this to [place] per my request). Pt is taking Lipitor and Zetia - being filled by PCP Confirmed w/ wife that pt is NOT taking Crestor as well. [facility] advised to cancel Rx for Crestor that was sent in by our office.  Forwarding to MD for review.  Pt is overdue for follow up w/ general cardiology and confirming w/ MD that pt needs to  schedule an overdue follow up appt....Marland KitchenMarland Kitchen

## 2023-01-08 NOTE — Telephone Encounter (Signed)
Robin from [facility] called because she said that Dr. Anne Fu has patient on rosuvastatin (CRESTOR) 40 MG tablet while his primary care has him taking atorvastatin (LIPITOR) 80 MG tablet. Pharmacy wants to know which medication to you are going to go with

## 2023-02-01 NOTE — Telephone Encounter (Signed)
Forwarding to schedulers to arrange overdue follow up with Dr. Anne Fu (Last seen 09/2020)

## 2023-02-14 DIAGNOSIS — M14672 Charcot's joint, left ankle and foot: Secondary | ICD-10-CM | POA: Diagnosis not present

## 2023-05-13 ENCOUNTER — Other Ambulatory Visit: Payer: Self-pay | Admitting: Cardiology

## 2023-05-13 DIAGNOSIS — E785 Hyperlipidemia, unspecified: Secondary | ICD-10-CM

## 2023-05-13 DIAGNOSIS — R7401 Elevation of levels of liver transaminase levels: Secondary | ICD-10-CM

## 2023-05-13 DIAGNOSIS — Z951 Presence of aortocoronary bypass graft: Secondary | ICD-10-CM

## 2023-05-13 DIAGNOSIS — Z79899 Other long term (current) drug therapy: Secondary | ICD-10-CM

## 2023-05-17 DIAGNOSIS — M14672 Charcot's joint, left ankle and foot: Secondary | ICD-10-CM | POA: Diagnosis not present

## 2023-11-22 ENCOUNTER — Telehealth: Payer: Self-pay

## 2023-11-22 NOTE — Telephone Encounter (Signed)
 Patient has not been seen by Dr. Renna Cary in 3 years.  He is scheduled to reestablish with Dr. Renna Cary on 11/25/2023, will defer to Dr. Renna Cary regarding cardiac clearance at the time of visit.

## 2023-11-22 NOTE — Telephone Encounter (Signed)
 Preop clearance added to appt notes on 4/28 appt with Dr. Renna Cary

## 2023-11-22 NOTE — Telephone Encounter (Signed)
   Pre-operative Risk Assessment    Patient Name: Eddie Black  DOB: 1960-12-31 MRN: [id number]   Date of last office visit: 10/27/2020 Date of next office visit: 11/25/2023   Request for Surgical Clearance    Procedure:   Repair Umbilical Hernia  with Excision of Umbilicus  Date of Surgery:  Clearance TBD                                Surgeon:  Not indicated Surgeon's Group or Practice Name:  [facility] Phone number:  [phone] Fax number:  [phone]   Type of Clearance Requested:   - Medical    Type of Anesthesia:  General    Additional requests/questions:    Gardiner Jumper   11/22/2023, 8:32 AM

## 2023-11-25 ENCOUNTER — Ambulatory Visit: Payer: BC Managed Care – PPO | Admitting: Cardiology

## 2023-11-27 DIAGNOSIS — I252 Old myocardial infarction: Secondary | ICD-10-CM

## 2023-11-27 HISTORY — DX: Old myocardial infarction: I25.2

## 2023-11-29 ENCOUNTER — Telehealth: Payer: Self-pay | Admitting: Cardiology

## 2023-11-29 ENCOUNTER — Encounter: Payer: Self-pay | Admitting: Cardiology

## 2023-11-29 ENCOUNTER — Ambulatory Visit: Attending: Cardiology | Admitting: Cardiology

## 2023-11-29 DIAGNOSIS — Z01810 Encounter for preprocedural cardiovascular examination: Secondary | ICD-10-CM

## 2023-11-29 DIAGNOSIS — I10 Essential (primary) hypertension: Secondary | ICD-10-CM

## 2023-11-29 DIAGNOSIS — Z951 Presence of aortocoronary bypass graft: Secondary | ICD-10-CM

## 2023-11-29 DIAGNOSIS — E785 Hyperlipidemia, unspecified: Secondary | ICD-10-CM

## 2023-11-29 DIAGNOSIS — K429 Umbilical hernia without obstruction or gangrene: Secondary | ICD-10-CM

## 2023-11-29 DIAGNOSIS — I2510 Atherosclerotic heart disease of native coronary artery without angina pectoris: Secondary | ICD-10-CM

## 2023-11-29 HISTORY — DX: Encounter for preprocedural cardiovascular examination: Z01.810

## 2023-11-29 NOTE — Telephone Encounter (Signed)
 Spoke with spouse. Pt is scheduled for Monday for Lexi Scan at [facility]

## 2023-11-29 NOTE — Telephone Encounter (Signed)
 Pt is returning call to the office and wasn't sure of the persons name that called

## 2023-11-29 NOTE — Progress Notes (Unsigned)
 Cardiology Office Note:    Date:  11/29/2023   ID:  Eddie Black, DOB 11-13-1960, MRN [id number]  PCP:  Lonie Roa, MD  Cardiologist:  Ralene Burger, MD    Referring MD: Lonie Roa, MD   Chief Complaint  Patient presents with   Medical Clearance    History of Present Illness:    Eddie Black is a 63 y.o. male past medical history significant for coronary artery disease, in 2019 he did have coronary bypass graft triple-vessel in the face of cardiac arrest, thereafter ejection fraction was preserved.  Additional problem include dyslipidemia diabetes hypertension smoking that he recently quit.  He was referred to me for evaluation before elective umbilical hernia surgery.  Overall he said he is doing fine denies having chest pain tightness squeezing pressure burning chest but at the same time he tells me he does not do much exercises.  Past Medical History:  Diagnosis Date   Acute hypoxemic respiratory failure (HCC)    Cardiac arrest (HCC) 10/25/2017   COPD exacerbation (HCC)    Diabetes mellitus (HCC)    Hyperlipemia    Hypertension    MI, acute, non ST segment elevation (HCC)    S/P hip replacement, right 07/18/2021   Tobacco abuse    Ventricular fibrillation (HCC) 10/25/2017    Past Surgical History:  Procedure Laterality Date   CORONARY ARTERY BYPASS GRAFT N/A 10/25/2017   Procedure: CORONARY ARTERY BYPASS GRAFTING (CABG) times three, WITH ENDOSCOPIC HARVESTING OF RIGHT SAPHENOUS VEIN;  Surgeon: Heriberto London, MD;  Location: [facility];  Service: Open Heart Surgery;  Laterality: N/A;   IABP INSERTION N/A 10/25/2017   Procedure: IABP Insertion;  Surgeon: Arty Binning, MD;  Location: [facility] INVASIVE CV LAB;  Service: Cardiovascular;  Laterality: N/A;   LEFT HEART CATH AND CORONARY ANGIOGRAPHY N/A 10/25/2017   Procedure: LEFT HEART CATH AND CORONARY ANGIOGRAPHY;  Surgeon: Arty Binning, MD;  Location: [facility] INVASIVE CV LAB;  Service: Cardiovascular;  Laterality: N/A;   TEE  WITHOUT CARDIOVERSION N/A 10/25/2017   Procedure: TRANSESOPHAGEAL ECHOCARDIOGRAM (TEE);  Surgeon: Matt Song, Donata Fryer, MD;  Location: [facility] OR;  Service: Open Heart Surgery;  Laterality: N/A;    Current Medications: Current Meds  Medication Sig   diclofenac (VOLTAREN) 75 MG EC tablet Take 75 mg by mouth 2 (two) times daily.   ezetimibe (ZETIA) 10 MG tablet Take 10 mg by mouth daily.   hydrOXYzine (ATARAX) 25 MG tablet Take 25 mg by mouth every 6 (six) hours as needed for anxiety.   JARDIANCE  25 MG TABS tablet Take 25 mg by mouth daily.   levothyroxine (SYNTHROID, LEVOTHROID) 25 MCG tablet Take 25 mcg by mouth daily.   metoprolol  succinate (TOPROL -XL) 25 MG 24 hr tablet Take 25 mg by mouth daily.   rosuvastatin  (CRESTOR ) 40 MG tablet Take 40 mg by mouth daily.   TRULICITY 3 MG/0.5ML SOPN Inject 3 mg into the skin once a week.   [DISCONTINUED] atorvastatin  (LIPITOR) 80 MG tablet Take 80 mg by mouth at bedtime.   [DISCONTINUED] enalapril  (VASOTEC ) 10 MG tablet Take 1 tablet (10 mg total) by mouth daily.   [DISCONTINUED] loratadine (CLARITIN) 10 MG tablet Take 10 mg by mouth daily as needed for allergies.   [DISCONTINUED] metFORMIN  (GLUCOPHAGE ) 500 MG tablet Take 500 mg by mouth 2 (two) times daily with a meal.   [DISCONTINUED] Omega-3 Fatty Acids (FISH OIL TRIPLE STRENGTH PO) Take 1 capsule by mouth 2 (two) times daily.   [DISCONTINUED] oxyCODONE -acetaminophen  (PERCOCET/ROXICET) 5-325 MG  tablet Take 1 tablet by mouth every 6 (six) hours as needed for severe pain.     Allergies:   Patient has no known allergies.   Social History   Socioeconomic History   Marital status: Married    Spouse name: Not on file   Number of children: Not on file   Years of education: Not on file   Highest education level: Not on file  Occupational History   Not on file  Tobacco Use   Smoking status: Former   Smokeless tobacco: Never  Vaping Use   Vaping status: Every Day  Substance and Sexual Activity    Alcohol use: Yes   Drug use: Yes    Types: Marijuana   Sexual activity: Not on file  Other Topics Concern   Not on file  Social History Narrative   Not on file   Social Drivers of Health   Financial Resource Strain: Not on file  Food Insecurity: No Food Insecurity (10/13/2022)   Hunger Vital Sign    Worried About Running Out of Food in the Last Year: Never true    Ran Out of Food in the Last Year: Never true  Transportation Needs: No Transportation Needs (10/13/2022)   PRAPARE - Administrator, Civil Service (Medical): No    Lack of Transportation (Non-Medical): No  Physical Activity: Not on file  Stress: Not on file  Social Connections: Not on file     Family History: The patient's Family history is unknown by patient. ROS:   Please see the history of present illness.    All 14 point review of systems negative except as described per history of present illness  EKGs/Labs/Other Studies Reviewed:         Recent Labs: No results found for requested labs within last 365 days.  Recent Lipid Panel    Component Value Date/Time   CHOL 173 10/27/2020 0919   TRIG 114 10/27/2020 0919   HDL 50 10/27/2020 0919   CHOLHDL 3.5 10/27/2020 0919   CHOLHDL 3.0 10/25/2017 1529   VLDL 8 10/25/2017 1529   LDLCALC 102 (H) 10/27/2020 0919    Physical Exam:    VS:  There were no vitals taken for this visit.    Wt Readings from Last 3 Encounters:  10/23/22 195 lb (88.5 kg)  10/12/22 194 lb (88 kg)  05/16/22 229 lb (103.9 kg)     GEN:  Well nourished, well developed in no acute distress HEENT: Normal NECK: No JVD; No carotid bruits LYMPHATICS: No lymphadenopathy CARDIAC: RRR, no murmurs, no rubs, no gallops RESPIRATORY:  Clear to auscultation without rales, wheezing or rhonchi  ABDOMEN: Soft, non-tender, non-distended MUSCULOSKELETAL:  No edema; No deformity  SKIN: Warm and dry LOWER EXTREMITIES: no swelling NEUROLOGIC:  Alert and oriented x 3 PSYCHIATRIC:  Normal  affect   ASSESSMENT:    1. Essential hypertension   2. Preop cardiovascular exam   3. Coronary artery disease involving native coronary artery of native heart without angina pectoris   4. S/P CABG x 3   5. Hyperlipidemia LDL goal <70   6. Umbilical hernia without obstruction and without gangrene    PLAN:    In order of problems listed above:  Cardiovascular evaluation for this gentleman with advanced coronary artery disease status post coronary artery bypass grafting 2019.  In spite of the fact he does not have any symptomatology that would suggest activation of coronary artery disease I think he deserves to have a stress  test and there is what I want to do it is the fact that he had diabetes many times people with diabetes have no symptoms when they have active disease and that is exactly what happened in 2019 when he got some very atypical symptoms and then he ended up having cardiac arrest and was found to have severe triple-vessel disease on top of that his ability to exercise limited he does not do much exercise so he cannot be cleared for surgery based on those data stress test need to be done. History of cardiac arrest and MI.  Will repeat echocardiogram. Overall risk assessment I will schedule him to have carotic ultrasound to make sure he does not have any significant carotic arterial disease. Dyslipidemia I did review his K PN which show me his LDL of 48 HDL 72 excellent control continue present management. Diabetes mellitus followed by internal medicine team. If his stress test is negative then we can proceed with surgery.  I already spoke to his surgeon   Medication Adjustments/Labs and Tests Ordered: Current medicines are reviewed at length with the patient today.  Concerns regarding medicines are outlined above.  Orders Placed This Encounter  Procedures   EKG 12-Lead   Medication changes: No orders of the defined types were placed in this encounter.   Signed, Manfred Seed, MD, [facility] 11/29/2023 10:09 AM    [facility]

## 2023-11-29 NOTE — Patient Instructions (Addendum)
 Medication Instructions:  Your physician recommends that you continue on your current medications as directed. Please refer to the Current Medication list given to you today.  *If you need a refill on your cardiac medications before your next appointment, please call your pharmacy*   Lab Work: None Ordered If you have labs (blood work) drawn today and your tests are completely normal, you will receive your results only by: MyChart Message (if you have MyChart) OR A paper copy in the mail If you have any lab test that is abnormal or we need to change your treatment, we will call you to review the results.   Testing/Procedures: Your physician has requested that you have a lexiscan myoview. For further information please visit [email or web address] Please follow instruction sheet, as given.  The test will take approximately 3 to 4 hours to complete; you may bring reading material.  If someone comes with you to your appointment, they will need to remain in the main lobby due to limited space in the testing area.    How to prepare for your Myocardial Perfusion Test: Do not eat or drink 3 hours prior to your test, except you may have water. Do not consume products containing caffeine (regular or decaffeinated) 12 hours prior to your test. (ex: coffee, chocolate, sodas, tea). Do bring a list of your current medications with you.  If not listed below, you may take your medications as normal. Do wear comfortable clothes (no dresses or overalls) and walking shoes, tennis shoes preferred (No heels or open toe shoes are allowed). Do NOT wear cologne, perfume, aftershave, or lotions (deodorant is allowed). If these instructions are not followed, your test will have to be rescheduled.     Your physician has requested that you have an echocardiogram. Echocardiography is a painless test that uses sound waves to create images of your heart. It provides your doctor with information about the size and shape  of your heart and how well your heart's chambers and valves are working. This procedure takes approximately one hour. There are no restrictions for this procedure. Please do NOT wear cologne, perfume, aftershave, or lotions (deodorant is allowed). Please arrive 15 minutes prior to your appointment time.  Your physician has requested that you have a carotid duplex. This test is an ultrasound of the carotid arteries in your neck. It looks at blood flow through these arteries that supply the brain with blood. Allow one hour for this exam. There are no restrictions or special instructions.   Please note: We ask at that you not bring children with you during ultrasound (echo/ vascular) testing. Due to room size and safety concerns, children are not allowed in the ultrasound rooms during exams. Our front office staff cannot provide observation of children in our lobby area while testing is being conducted. An adult accompanying a patient to their appointment will only be allowed in the ultrasound room at the discretion of the ultrasound technician under special circumstances. We apologize for any inconvenience.    Follow-Up: At [facility], you and your health needs are our priority.  As part of our continuing mission to provide you with exceptional heart care, we have created designated Provider Care Teams.  These Care Teams include your primary Cardiologist (physician) and Advanced Practice Providers (APPs -  Physician Assistants and Nurse Practitioners) who all work together to provide you with the care you need, when you need it.  We recommend signing up for the patient portal called "MyChart".  Sign up  information is provided on this After Visit Summary.  MyChart is used to connect with patients for Virtual Visits (Telemedicine).  Patients are able to view lab/test results, encounter notes, upcoming appointments, etc.  Non-urgent messages can be sent to your provider as well.   To learn more about  what you can do with MyChart, go to [email or web address].    Your next appointment:   3 month(s)  The format for your next appointment:   In Person  Provider:   Ralene Burger, MD    Other Instructions NA

## 2023-12-04 NOTE — Telephone Encounter (Signed)
   Patient Name: Eddie Black  DOB: March 20, 1961 MRN: [id number]  Primary Cardiologist: Dorothye Gathers, MD  Chart reviewed as part of pre-operative protocol coverage. Last office was on 11/29/23 with Dr. Krasowksi. Per Dr. Tonja Fray notes, "If his stress test is negative then we can proceed with surgery."  Patient underwent nuclear stress test on 12/02/2023 which showed no ischemia and normal ejection fraction.  Given past medical history and time since last visit, based on ACC/AHA guidelines, Esvin Gaymon is at acceptable risk for the planned procedure without further cardiovascular testing.   I will route this recommendation to the requesting party via Epic fax function and remove from pre-op pool.  Please call with questions.  Ronin Rehfeldt D Lynzee Lindquist, NP 12/04/2023, 10:58 AM

## 2024-01-08 ENCOUNTER — Encounter: Payer: Self-pay | Admitting: Cardiology

## 2024-03-02 ENCOUNTER — Ambulatory Visit: Admitting: Cardiology

## 2024-03-06 ENCOUNTER — Other Ambulatory Visit: Payer: Self-pay

## 2024-03-06 DIAGNOSIS — E785 Hyperlipidemia, unspecified: Secondary | ICD-10-CM | POA: Insufficient documentation

## 2024-03-06 DIAGNOSIS — E119 Type 2 diabetes mellitus without complications: Secondary | ICD-10-CM | POA: Insufficient documentation

## 2024-03-06 DIAGNOSIS — I10 Essential (primary) hypertension: Secondary | ICD-10-CM | POA: Insufficient documentation

## 2024-03-09 ENCOUNTER — Ambulatory Visit: Admitting: Cardiology

## 2024-05-25 ENCOUNTER — Ambulatory Visit: Attending: Cardiology | Admitting: Cardiology

## 2024-05-25 ENCOUNTER — Encounter: Payer: Self-pay | Admitting: Cardiology

## 2024-05-25 VITALS — BP 124/72 | HR 83 | Ht 68.0 in | Wt 202.0 lb

## 2024-05-25 DIAGNOSIS — Z951 Presence of aortocoronary bypass graft: Secondary | ICD-10-CM | POA: Diagnosis not present

## 2024-05-25 DIAGNOSIS — E119 Type 2 diabetes mellitus without complications: Secondary | ICD-10-CM | POA: Diagnosis not present

## 2024-05-25 DIAGNOSIS — I2510 Atherosclerotic heart disease of native coronary artery without angina pectoris: Secondary | ICD-10-CM | POA: Diagnosis not present

## 2024-05-25 DIAGNOSIS — I10 Essential (primary) hypertension: Secondary | ICD-10-CM

## 2024-05-25 DIAGNOSIS — Z720 Tobacco use: Secondary | ICD-10-CM

## 2024-05-25 NOTE — Patient Instructions (Signed)
 Medication Instructions:  Your physician recommends that you continue on your current medications as directed. Please refer to the Current Medication list given to you today.  *If you need a refill on your cardiac medications before your next appointment, please call your pharmacy*   Lab Work: None ordered If you have labs (blood work) drawn today and your tests are completely normal, you will receive your results only by: MyChart Message (if you have MyChart) OR A paper copy in the mail If you have any lab test that is abnormal or we need to change your treatment, we will call you to review the results.   Testing/Procedures: Your physician has requested that you have a carotid duplex. This test is an ultrasound of the carotid arteries in your neck. It looks at blood flow through these arteries that supply the brain with blood. Allow one hour for this exam. There are no restrictions or special instructions.    Follow-Up: At [facility], you and your health needs are our priority.  As part of our continuing mission to provide you with exceptional heart care, we have created designated Provider Care Teams.  These Care Teams include your primary Cardiologist (physician) and Advanced Practice Providers (APPs -  Physician Assistants and Nurse Practitioners) who all work together to provide you with the care you need, when you need it.  We recommend signing up for the patient portal called MyChart.  Sign up information is provided on this After Visit Summary.  MyChart is used to connect with patients for Virtual Visits (Telemedicine).  Patients are able to view lab/test results, encounter notes, upcoming appointments, etc.  Non-urgent messages can be sent to your provider as well.   To learn more about what you can do with MyChart, go to [email or web address].    Your next appointment:   6 month(s)  The format for your next appointment:   In Person  Provider:   Lamar Fitch, MD    Other Instructions none  Important Information About Sugar

## 2024-05-25 NOTE — Progress Notes (Unsigned)
 Cardiology Office Note:    Date:  05/25/2024   ID:  Eddie Black, DOB 1960-11-02, MRN [id number]  PCP:  Trinidad Hun, MD  Cardiologist:  Lamar Fitch, MD    Referring MD: Fitch Lamar PARAS, MD   Chief Complaint  Patient presents with   Follow-up  Doing fine  History of Present Illness:    Eddie Black is a 63 y.o. male past medical history significant for coronary artery disease in 2019 he did have coronary bypass graft triple-vessel in fives of cardiac arrest after that ejection fraction was preserved, additional problem include dyslipidemia diabetes hypertension smoking which he quit.  Comes today for follow-up last time I seen he was evaluation before elective hernia surgery, evaluation included a stress test which was negative after that patient did well.  Denies have any chest pain tightness squeezing pressure burning chest  Past Medical History:  Diagnosis Date   BMI 35.0-35.9,adult 06/04/2017   CAD (coronary artery disease), native coronary artery 10/25/2017   Charcot arthropathy of midfoot 10/05/2022   Diabetes mellitus (HCC)    Diabetes mellitus type II, controlled (HCC) 10/25/2017   Essential hypertension 10/25/2017   Foot abscess, left 10/13/2022   Foot osteomyelitis, left (HCC) 10/12/2022   History of heart attack 11/27/2023   History of ventricular fibrillation 10/12/2022   Hyperlipemia    Hyperlipidemia LDL goal <70 10/25/2017   Hypertension    Pain in left ankle and joints of left foot 10/05/2022   Pain in left foot 05/16/2022   Preop cardiovascular exam 11/29/2023   Primary osteoarthritis of right hip 07/11/2021   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   S/P hip replacement, right 07/18/2021   Tobacco abuse    Umbilical hernia without obstruction and without gangrene 06/04/2017    Past Surgical History:  Procedure Laterality Date   CORONARY ARTERY BYPASS GRAFT N/A 10/25/2017   Procedure: CORONARY ARTERY BYPASS GRAFTING (CABG) times three, WITH ENDOSCOPIC  HARVESTING OF RIGHT SAPHENOUS VEIN;  Surgeon: Fleeta Hanford Coy, MD;  Location: [facility] OR;  Service: Open Heart Surgery;  Laterality: N/A;   IABP INSERTION N/A 10/25/2017   Procedure: IABP Insertion;  Surgeon: Claudene Victory ORN, MD;  Location: [facility];  Service: Cardiovascular;  Laterality: N/A;   LEFT HEART CATH AND CORONARY ANGIOGRAPHY N/A 10/25/2017   Procedure: LEFT HEART CATH AND CORONARY ANGIOGRAPHY;  Surgeon: Claudene Victory ORN, MD;  Location: [facility];  Service: Cardiovascular;  Laterality: N/A;   TEE WITHOUT CARDIOVERSION N/A 10/25/2017   Procedure: TRANSESOPHAGEAL ECHOCARDIOGRAM (TEE);  Surgeon: Fleeta Hanford, Coy, MD;  Location: [facility] OR;  Service: Open Heart Surgery;  Laterality: N/A;    Current Medications: Current Meds  Medication Sig   atorvastatin  (LIPITOR) 80 MG tablet Take 80 mg by mouth at bedtime.   ezetimibe (ZETIA) 10 MG tablet Take 10 mg by mouth daily.   JARDIANCE  25 MG TABS tablet Take 25 mg by mouth daily.   levothyroxine (SYNTHROID, LEVOTHROID) 25 MCG tablet Take 25 mcg by mouth daily.   metoprolol  succinate (TOPROL -XL) 25 MG 24 hr tablet Take 25 mg by mouth daily.   TRULICITY 3 MG/0.5ML SOPN Inject 3 mg into the skin once a week.     Allergies:   Patient has no known allergies.   Social History   Socioeconomic History   Marital status: Married    Spouse name: Not on file   Number of children: Not on file   Years of education: Not on file   Highest education level:  Not on file  Occupational History   Not on file  Tobacco Use   Smoking status: Former   Smokeless tobacco: Never  Vaping Use   Vaping status: Every Day  Substance and Sexual Activity   Alcohol use: Yes   Drug use: Yes    Types: Marijuana   Sexual activity: Not on file  Other Topics Concern   Not on file  Social History Narrative   Not on file   Social Drivers of Health   Financial Resource Strain: Not on file  Food Insecurity: No Food Insecurity (10/13/2022)   Hunger Vital Sign     Worried About Running Out of Food in the Last Year: Never true    Ran Out of Food in the Last Year: Never true  Transportation Needs: No Transportation Needs (10/13/2022)   PRAPARE - Administrator, Civil Service (Medical): No    Lack of Transportation (Non-Medical): No  Physical Activity: Not on file  Stress: Not on file  Social Connections: Not on file     Family History: The patient's Family history is unknown by patient. ROS:   Please see the history of present illness.    All 14 point review of systems negative except as described per history of present illness  EKGs/Labs/Other Studies Reviewed:         Recent Labs: No results found for requested labs within last 365 days.  Recent Lipid Panel    Component Value Date/Time   CHOL 173 10/27/2020 0919   TRIG 114 10/27/2020 0919   HDL 50 10/27/2020 0919   CHOLHDL 3.5 10/27/2020 0919   CHOLHDL 3.0 10/25/2017 1529   VLDL 8 10/25/2017 1529   LDLCALC 102 (H) 10/27/2020 0919    Physical Exam:    VS:  BP 124/72   Pulse 83   Ht 5' 8 (1.727 m)   Wt 202 lb (91.6 kg)   SpO2 93%   BMI 30.71 kg/m     Wt Readings from Last 3 Encounters:  05/25/24 202 lb (91.6 kg)  10/23/22 195 lb (88.5 kg)  10/12/22 194 lb (88 kg)     GEN:  Well nourished, well developed in no acute distress HEENT: Normal NECK: No JVD; No carotid bruits LYMPHATICS: No lymphadenopathy CARDIAC: RRR, no murmurs, no rubs, no gallops RESPIRATORY:  Clear to auscultation without rales, wheezing or rhonchi  ABDOMEN: Soft, non-tender, non-distended MUSCULOSKELETAL:  No edema; No deformity  SKIN: Warm and dry LOWER EXTREMITIES: no swelling NEUROLOGIC:  Alert and oriented x 3 PSYCHIATRIC:  Normal affect   ASSESSMENT:    1. Coronary artery disease involving native coronary artery of native heart without angina pectoris   2. Essential hypertension   3. Diabetes mellitus type II, controlled (HCC)   4. S/P CABG x 3   5. Tobacco abuse     PLAN:    In order of problems listed above:  Coronary disease stable from that but reviewed recent stress test review went to surgery for umbilical hernia without any difficulties will start aspirin  for some reason he is not on it. Essential hypertension blood pressure well-controlled. Dyslipidemia I did review [facility] which show me LDL 40 HDL 61 this is from August of this year continue present management. Tobacco abuse likely he stopped months ago abstain from smoking I congratulated him and encouraged him to stay away from it. Atherosclerosis on overall evaluation I think he can benefit from cardiac ultrasounds will do the test   Medication Adjustments/Labs and Tests  Ordered: Current medicines are reviewed at length with the patient today.  Concerns regarding medicines are outlined above.  No orders of the defined types were placed in this encounter.  Medication changes: No orders of the defined types were placed in this encounter.   Signed, Lamar DOROTHA Fitch, MD, [facility] 05/25/2024 2:35 PM    [facility]

## 2024-06-17 ENCOUNTER — Ambulatory Visit: Attending: Cardiology

## 2024-06-17 ENCOUNTER — Ambulatory Visit: Payer: Self-pay | Admitting: Cardiology

## 2024-06-17 DIAGNOSIS — I2510 Atherosclerotic heart disease of native coronary artery without angina pectoris: Secondary | ICD-10-CM

## 2024-08-18 ENCOUNTER — Other Ambulatory Visit (HOSPITAL_BASED_OUTPATIENT_CLINIC_OR_DEPARTMENT_OTHER): Payer: Self-pay | Admitting: Family Medicine

## 2024-08-18 DIAGNOSIS — Z87891 Personal history of nicotine dependence: Secondary | ICD-10-CM
